# Patient Record
Sex: Female | Born: 2000 | Race: White | Hispanic: No | Marital: Single | Smoking: Never smoker
Health system: Southern US, Community
[De-identification: ages and names within clinical notes are randomized; demographics above are authoritative.]

## PROBLEM LIST (undated history)

## (undated) DIAGNOSIS — D649 Anemia, unspecified: Secondary | ICD-10-CM

## (undated) DIAGNOSIS — M069 Rheumatoid arthritis, unspecified: Secondary | ICD-10-CM

## (undated) HISTORY — DX: Rheumatoid arthritis, unspecified: M06.9

## (undated) HISTORY — DX: Anemia, unspecified: D64.9

---
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
## 2020-01-03 ENCOUNTER — Ambulatory Visit: Payer: Self-pay | Attending: Internal Medicine

## 2021-12-12 ENCOUNTER — Emergency Department
Admission: EM | Admit: 2021-12-12 | Discharge: 2021-12-12 | Disposition: A | Discharge status: Home / Self Care | Payer: BC Managed Care – PPO | Attending: Emergency Medicine | Admitting: Emergency Medicine

## 2021-12-12 ENCOUNTER — Emergency Department: Payer: BC Managed Care – PPO

## 2021-12-12 ENCOUNTER — Other Ambulatory Visit: Payer: Self-pay

## 2021-12-12 ENCOUNTER — Encounter: Payer: Self-pay | Admitting: Emergency Medicine

## 2021-12-12 DIAGNOSIS — M7121 Synovial cyst of popliteal space [Baker], right knee: Secondary | ICD-10-CM | POA: Diagnosis not present

## 2021-12-12 DIAGNOSIS — M79661 Pain in right lower leg: Secondary | ICD-10-CM | POA: Diagnosis present

## 2021-12-12 LAB — POC URINE PREG, ED: Preg Test, Ur: NEGATIVE

## 2021-12-12 MED ORDER — IBUPROFEN 600 MG PO TABS: 600.0000 mg | ORAL_TABLET | Freq: Four times a day (QID) | ORAL | 0 refills | Status: AC | PRN

## 2021-12-12 NOTE — ED Provider Notes (Addendum)
? ?[facility] ?Provider Note ? ? ? Event Date/Time  ? First MD Initiated Contact with Patient 12/12/21 1704   ?  (approximate) ? ? ?History  ? ?Leg Pain ? ? ?HPI ? ?Glenda Kennedy is a 21 y.o. female who is otherwise healthy who comes in for right calf pain to rule out DVT.  Patient reports back in May she noticed some decreased mobility in her right leg where she had difficulty flexing it secondary to some discomfort.  Then a few days ago she noticed some increasing pain in her calf but was then able to have full range of motion of her knee.  She denies any new trauma to her knee.  Denies any history of blood clots.  Denies any numbness or tingling.  She reports still being able to bear weight on it. ? ? ?Physical Exam  ? ?Triage Vital Signs: ?ED Triage Vitals  ?Enc Vitals Group  ?   BP 12/12/21 1624 121/87  ?   Pulse Rate 12/12/21 1624 67  ?   Resp 12/12/21 1624 16  ?   Temp 12/12/21 1624 97.9 ?F (36.6 ?C)  ?   Temp Source 12/12/21 1624 Oral  ?   SpO2 12/12/21 1624 95 %  ?   Weight 12/12/21 1622 165 lb (74.8 kg)  ?   Height 12/12/21 1622 5\' 8"  (1.727 m)  ?   Head Circumference --   ?   Peak Flow --   ?   Pain Score 12/12/21 1622 4  ?   Pain Loc --   ?   Pain Edu? --   ?   Excl. in GC? --   ? ? ?Most recent vital signs: ?Vitals:  ? 12/12/21 1624  ?BP: 121/87  ?Pulse: 67  ?Resp: 16  ?Temp: 97.9 ?F (36.6 ?C)  ?SpO2: 95%  ? ? ? ?General: Awake, no distress.  ?CV:  Good peripheral perfusion.  ?Resp:  Normal effort.  ?Abd:  No distention.  ?Other:  Patient has a little bit of fullness in her calf but soft compartments still.  She is got sensation is intact with good arterial pulses.  She has some slight swelling noted behind the right knee but no erythema noted. Slight warm noted.  ? ?ED Results / Procedures / Treatments  ? ?RADIOLOGY ?I have reviewed the US personally  and there is possible rupture bakers cyst  ? ? ?PROCEDURES: ? ?Critical Care performed: No ? ?Procedures ? ? ?MEDICATIONS ORDERED  IN ED: ?Medications - No data to display ? ? ?IMPRESSION / MDM / ASSESSMENT AND PLAN / ED COURSE  ?I reviewed the triage vital signs and the nursing notes. ?             ?               ? ?Differential diagnosis includes, but is not limited to, DVT, hematoma, Baker's cyst.  Lower suspicion for hematoma or fracture given patient denies any recent trauma.  Does not appear to be a septic joint based upon examination.  Patient is neurovascularly intact ? ?Ultrasound concerning for possible large ruptured Baker's cyst or hematoma.  She is currently neurovascularly intact we did discuss return precautions in regards to this.  Also incidentally noted lymph node which patient can get follow-up for outpatient but patient is afebrile do not believe this is an infectious process.  Discussed this with patient that she can discuss with her primary care doctor and the need for repeat  ultrasound in 4 to 6 weeks to see if it is still there  ? ?Recommended NSAIDs, rest, knee brace for comfort and patient actually already has follow-up with orthopedics in 2 days to discuss further management of this.  We discussed return precautions in regards to neurovascular issues ? ?I discussed the provisional nature of ED diagnosis, the treatment so far, the ongoing plan of care, follow up appointments and return precautions with the patient and any family or support people present. They expressed understanding and agreed with the plan, discharged home. ? ? ? ? ? ? ?FINAL CLINICAL IMPRESSION(S) / ED DIAGNOSES  ? ?Final diagnoses:  ?Synovial cyst of right popliteal space  ? ? ? ?Rx / DC Orders  ? ?ED Discharge Orders   ? ?      Ordered  ?  ibuprofen (ADVIL) 600 MG tablet  Every 6 hours PRN       ? 12/12/21 1836  ? ?  ?  ? ?  ? ? ? ?Note:  This document was prepared using Dragon voice recognition software and may include unintentional dictation errors. ?  ?Concha Se, MD ?12/12/21 1851 ? ?  ?Concha Se, MD ?12/12/21 1907 ? ?

## 2021-12-12 NOTE — ED Notes (Signed)
Pt in US at this time 

## 2021-12-12 NOTE — ED Triage Notes (Signed)
C/O right calf pain x 4 days.  Sent to ED to R?O DVT ?

## 2021-12-12 NOTE — Discharge Instructions (Addendum)
Follow-up with orthopedic doctor and take Tylenol 1 g every 8 hours and ibuprofen with food to help with any of the pain.  He can use the knee brace to help with comfort.  If you develop numbness or worsening calf pain or cold pale right foot please return ER immediately ? ?IMPRESSION: ?No evidence of deep venous thrombosis seen in right lower extremity. ?Possible large ruptured Baker's cyst or hematoma seen in right ?popliteal fossa and posterior calf. Also noted is 1.3 cm lymph node ?at the junction of the common femoral and greater saphenous veins. ?  ?

## 2022-07-08 ENCOUNTER — Other Ambulatory Visit: Payer: Self-pay

## 2022-07-08 ENCOUNTER — Ambulatory Visit (INDEPENDENT_AMBULATORY_CARE_PROVIDER_SITE_OTHER): Payer: BC Managed Care – PPO | Admitting: Oncology

## 2022-07-08 ENCOUNTER — Encounter: Payer: Self-pay | Admitting: Oncology

## 2022-07-08 VITALS — BP 106/74 | HR 113 | Temp 98.1°F | Resp 18 | Ht 69.0 in | Wt 171.0 lb

## 2022-07-08 DIAGNOSIS — M059 Rheumatoid arthritis with rheumatoid factor, unspecified: Secondary | ICD-10-CM

## 2022-07-08 NOTE — Progress Notes (Signed)
[address] [place], [place] [address] Phone: [phone] Fax: [phone]   Office Visit Note  Patient Name: Glenda Kennedy  Date of [id number]  Med Rec number [id number]  Date of Service: 07/08/2022  Patient has no known allergies.  Chief Complaint  Patient presents with   Bloodwork   Patient is an 21 y.o. student here for blood work ordered by Dr. Manuella Ghazi.  Would like results faxed to [facility] [place] at [phone].  Diagnosed with seropositive rheumatoid arthritis over the summer but has been having right knee and elbow pain since the spring 2022. Currently on plaquenil/ naproxen and prednisone which appears to be helping some.  Needs routine labs drawn here.  Will be seeing a [facility] rheumatologist in November.  Current Medication:  Outpatient Encounter Medications as of 07/08/2022  Medication Sig   hydroxychloroquine (PLAQUENIL) 200 MG tablet Take by mouth.   naproxen (NAPROSYN) 375 MG tablet Take 375 mg by mouth 2 (two) times daily.   predniSONE (DELTASONE) 2.5 MG tablet Take by mouth.   No facility-administered encounter medications on file as of 07/08/2022.    Medical History: History reviewed. No pertinent past medical history.  Vital Signs: BP 106/74   Pulse (!) 113   Temp 98.1 F (36.7 C) (Tympanic)   Resp 18   Ht _0  (1.753 m)   Wt 171 lb (77.6 kg)   SpO2 99%   BMI 25.25 kg/m   ROS: As per HPI.  All other pertinent ROS negative.     Review of Systems  Musculoskeletal:  Positive for joint swelling and myalgias.    Physical Exam Constitutional:      Appearance: Normal appearance.  Neurological:     Mental Status: She is alert and oriented to person, place, and time.     No results found for this or any previous visit (from the past 24 hour(s)).  Assessment/Plan: 1. Seropositive rheumatoid arthritis ([place]) -Labs today.  -We will fax labs to care [facility] at [phone] when  they result.  We will also send student results via [facility]. -Order requisition reviewed and will be scanned into epic.  - QuantiFERON-TB Gold Plus - CBC w/Diff - C-reactive protein - Acute Hep Panel & Hep B Surface Ab - Comp Met (CMET)   Disposition-return to clinic as needed.  General Counseling: Glenda Kennedy verbalizes understanding of the findings of todays visit and agrees with plan of treatment. I have discussed any further diagnostic evaluation that may be needed or ordered today. We also reviewed her medications today. she has been encouraged to call the office with any questions or concerns that should arise related to todays visit.   Orders Placed This Encounter  Procedures   QuantiFERON-TB Gold Plus   CBC w/Diff   C-reactive protein   Acute Hep Panel & Hep B Surface Ab   Comp Met (CMET)    No orders of the defined types were placed in this encounter.   I spent 15 minutes dedicated to the care of this patient (face-to-face and non-face-to-face) on the date of the encounter to include what is described in the assessment and plan.   Faythe Casa, NP 07/08/2022 11:25 AM

## 2022-07-10 LAB — QUANTIFERON-TB GOLD PLUS
QuantiFERON Mitogen Value: 2.33 IU/mL
QuantiFERON Nil Value: 0 IU/mL
QuantiFERON TB1 Ag Value: 0 IU/mL
QuantiFERON TB2 Ag Value: 0 IU/mL
QuantiFERON-TB Gold Plus: NEGATIVE

## 2022-07-10 LAB — COMPREHENSIVE METABOLIC PANEL
ALT: 10 IU/L (ref 0–32)
AST: 14 IU/L (ref 0–40)
Albumin/Globulin Ratio: 1.6 (ref 1.2–2.2)
Albumin: 4.7 g/dL (ref 4.0–5.0)
Alkaline Phosphatase: 92 IU/L (ref 44–121)
BUN/Creatinine Ratio: 20 (ref 9–23)
BUN: 15 mg/dL (ref 6–20)
Bilirubin Total: 0.2 mg/dL (ref 0.0–1.2)
CO2: 22 mmol/L (ref 20–29)
Calcium: 9.5 mg/dL (ref 8.7–10.2)
Chloride: 102 mmol/L (ref 96–106)
Creatinine, Ser: 0.74 mg/dL (ref 0.57–1.00)
Globulin, Total: 2.9 g/dL (ref 1.5–4.5)
Glucose: 98 mg/dL (ref 70–99)
Potassium: 4.4 mmol/L (ref 3.5–5.2)
Sodium: 141 mmol/L (ref 134–144)
Total Protein: 7.6 g/dL (ref 6.0–8.5)
eGFR: 118 mL/min/{1.73_m2} (ref >59)

## 2022-07-10 LAB — CBC WITH DIFFERENTIAL/PLATELET
Basophils Absolute: 0.1 10*3/uL (ref 0.0–0.2)
Basos: 0 %
EOS (ABSOLUTE): 0 10*3/uL (ref 0.0–0.4)
Eos: 0 %
Hematocrit: 36.5 % (ref 34.0–46.6)
Hemoglobin: 12.1 g/dL (ref 11.1–15.9)
Immature Grans (Abs): 0 10*3/uL (ref 0.0–0.1)
Immature Granulocytes: 0 %
Lymphocytes Absolute: 1.1 10*3/uL (ref 0.7–3.1)
Lymphs: 9 %
MCH: 28.5 pg (ref 26.6–33.0)
MCHC: 33.2 g/dL (ref 31.5–35.7)
MCV: 86 fL (ref 79–97)
Monocytes Absolute: 0.3 10*3/uL (ref 0.1–0.9)
Monocytes: 3 %
Neutrophils Absolute: 10 10*3/uL — ABNORMAL HIGH (ref 1.4–7.0)
Neutrophils: 88 %
Platelets: 332 10*3/uL (ref 150–450)
RBC: 4.24 x10E6/uL (ref 3.77–5.28)
RDW: 14.2 % (ref 11.7–15.4)
WBC: 11.6 10*3/uL — ABNORMAL HIGH (ref 3.4–10.8)

## 2022-07-10 LAB — ACUTE HEP PANEL AND HEP B SURFACE AB
Hep A IgM: NEGATIVE
Hep B C IgM: NEGATIVE
Hep C Virus Ab: NONREACTIVE
Hepatitis B Surf Ab Quant: 30.1 m[IU]/mL (ref >9.9)
Hepatitis B Surface Ag: NEGATIVE

## 2022-07-10 LAB — C-REACTIVE PROTEIN: CRP: 33 mg/L — ABNORMAL HIGH (ref 0–10)

## 2022-08-01 ENCOUNTER — Encounter: Payer: Self-pay | Admitting: Oncology

## 2022-08-16 ENCOUNTER — Ambulatory Visit
Admission: RE | Admit: 2022-08-16 | Discharge: 2022-08-16 | Disposition: A | Discharge status: Home / Self Care | Payer: BC Managed Care – PPO | Source: Ambulatory Visit | Attending: Internal Medicine | Admitting: Internal Medicine

## 2022-08-16 ENCOUNTER — Ambulatory Visit (INDEPENDENT_AMBULATORY_CARE_PROVIDER_SITE_OTHER): Payer: BC Managed Care – PPO | Admitting: Internal Medicine

## 2022-08-16 ENCOUNTER — Encounter: Payer: Self-pay | Admitting: Internal Medicine

## 2022-08-16 ENCOUNTER — Ambulatory Visit (INDEPENDENT_AMBULATORY_CARE_PROVIDER_SITE_OTHER): Payer: BC Managed Care – PPO

## 2022-08-16 VITALS — BP 104/68 | HR 60 | Resp 15 | Ht 68.0 in | Wt 175.8 lb

## 2022-08-16 DIAGNOSIS — Z7952 Long term (current) use of systemic steroids: Secondary | ICD-10-CM | POA: Diagnosis present

## 2022-08-16 DIAGNOSIS — Z79899 Other long term (current) drug therapy: Secondary | ICD-10-CM | POA: Insufficient documentation

## 2022-08-16 DIAGNOSIS — M059 Rheumatoid arthritis with rheumatoid factor, unspecified: Secondary | ICD-10-CM | POA: Diagnosis present

## 2022-08-16 DIAGNOSIS — M660 Rupture of popliteal cyst: Secondary | ICD-10-CM | POA: Diagnosis present

## 2022-08-16 DIAGNOSIS — Z1159 Encounter for screening for other viral diseases: Secondary | ICD-10-CM | POA: Insufficient documentation

## 2022-08-16 DIAGNOSIS — E559 Vitamin D deficiency, unspecified: Secondary | ICD-10-CM | POA: Insufficient documentation

## 2022-08-16 NOTE — Patient Instructions (Signed)
Methotrexate Tablets What is this medication? METHOTREXATE (METH oh TREX ate) treats inflammatory conditions such as arthritis and psoriasis. It works by decreasing inflammation, which can reduce pain and prevent long-term injury to the joints and skin. It may also be used to treat some types of cancer. It works by slowing down the growth of cancer cells. This medicine may be used for other purposes; ask your health care provider or pharmacist if you have questions. COMMON BRAND NAME(S): Rheumatrex, Trexall What should I tell my care team before I take this medication? They need to know if you have any of these conditions: Fluid in the stomach area or lungs If you often drink alcohol Infection or immune system problems Kidney disease or on hemodialysis Liver disease Low blood counts, like low white cell, platelet, or red cell counts Lung disease Radiation therapy Stomach ulcers Ulcerative colitis An unusual or allergic reaction to methotrexate, other medications, foods, dyes, or preservatives Pregnant or trying to get pregnant Breast-feeding How should I use this medication? Take this medication by mouth with a glass of water. Follow the directions on the prescription label. Take your medication at regular intervals. Do not take it more often than directed. Do not stop taking except on your care team's advice. Make sure you know why you are taking this medication and how often you should take it. If this medication is used for a condition that is not cancer, like arthritis or psoriasis, it should be taken weekly, NOT daily. Taking this medication more often than directed can cause serious side effects, even death. Talk to your care team about safe handling and disposal of this medication. You may need to take special precautions. Talk to your care team about the use of this medication in children. While this medication may be prescribed for selected conditions, precautions do  apply. Overdosage: If you think you have taken too much of this medicine contact a poison control center or emergency room at once. NOTE: This medicine is only for you. Do not share this medicine with others. What if I miss a dose? If you miss a dose, talk with your care team. Do not take double or extra doses. What may interact with this medication? Do not take this medication with any of the following: Acitretin This medication may also interact with the following: Aspirin and aspirin-like medications including salicylates Azathioprine Certain antibiotics like penicillins, tetracycline, and chloramphenicol Certain medications that treat or prevent blood clots like warfarin, apixaban, dabigatran, and rivaroxaban Certain medications for stomach problems like esomeprazole, omeprazole, pantoprazole Cyclosporine Dapsone Diuretics Gold Hydroxychloroquine Live virus vaccines Medications for infection like acyclovir, adefovir, amphotericin B, bacitracin, cidofovir, foscarnet, ganciclovir, gentamicin, pentamidine, vancomycin Mercaptopurine NSAIDs, medications for pain and inflammation, like ibuprofen or naproxen Other cytotoxic agents Pamidronate Pemetrexed Penicillamine Phenylbutazone Phenytoin Probenecid Pyrimethamine Retinoids such as isotretinoin and tretinoin Steroid medications like prednisone or cortisone Sulfonamides like sulfasalazine and trimethoprim/sulfamethoxazole Theophylline Zoledronic acid This list may not describe all possible interactions. Give your health care provider a list of all the medicines, herbs, non-prescription drugs, or dietary supplements you use. Also tell them if you smoke, drink alcohol, or use illegal drugs. Some items may interact with your medicine. What should I watch for while using this medication? Avoid alcoholic drinks. This medication can make you more sensitive to the sun. Keep out of the sun. If you cannot avoid being in the sun, wear  protective clothing and use sunscreen. Do not use sun lamps or tanning beds/booths. You may need   blood work done while you are taking this medication. Call your care team for advice if you get a fever, chills or sore throat, or other symptoms of a cold or flu. Do not treat yourself. This medication decreases your body's ability to fight infections. Try to avoid being around people who are sick. This medication may increase your risk to bruise or bleed. Call your care team if you notice any unusual bleeding. Be careful brushing or flossing your teeth or using a toothpick because you may get an infection or bleed more easily. If you have any dental work done, tell your dentist you are receiving this medication. Check with your care team if you get an attack of severe diarrhea, nausea and vomiting, or if you sweat a lot. The loss of too much body fluid can make it dangerous for you to take this medication. Talk to your care team about your risk of cancer. You may be more at risk for certain types of cancers if you take this medication. Do not become pregnant while taking this medication or for 6 months after stopping it. Women should inform their care team if they wish to become pregnant or think they might be pregnant. Men should not father a child while taking this medication and for 3 months after stopping it. There is potential for serious harm to an unborn child. Talk to your care team for more information. Do not breast-feed an infant while taking this medication or for 1 week after stopping it. This medication may make it more difficult to get pregnant or father a child. Talk to your care team if you are concerned about your fertility. What side effects may I notice from receiving this medication? Side effects that you should report to your care team as soon as possible: Allergic reactions--skin rash, itching, hives, swelling of the face, lips, tongue, or throat Blood clot--pain, swelling, or warmth  in the leg, shortness of breath, chest pain Dry cough, shortness of breath or trouble breathing Infection--fever, chills, cough, sore throat, wounds that don't heal, pain or trouble when passing urine, general feeling of discomfort or being unwell Kidney injury--decrease in the amount of urine, swelling of the ankles, hands, or feet Liver injury--right upper belly pain, loss of appetite, nausea, light-colored stool, dark yellow or brown urine, yellowing of the skin or eyes, unusual weakness or fatigue Low red blood cell count--unusual weakness or fatigue, dizziness, headache, trouble breathing Redness, blistering, peeling, or loosening of the skin, including inside the mouth Seizures Unusual bruising or bleeding Side effects that usually do not require medical attention (report to your care team if they continue or are bothersome): Diarrhea Dizziness Hair loss Nausea Pain, redness, or swelling with sores inside the mouth or throat Vomiting This list may not describe all possible side effects. Call your doctor for medical advice about side effects. You may report side effects to FDA at 1-800-FDA-1088. Where should I keep my medication? Keep out of the reach of children and pets. Store at room temperature between 20 and 25 degrees C (68 and 77 degrees F). Protect from light. Get rid of any unused medication after the expiration date. Talk to your care team about how to dispose of unused medication. Special directions may apply. NOTE: This sheet is a summary. It may not cover all possible information. If you have questions about this medicine, talk to your doctor, pharmacist, or health care provider.  2023 Elsevier/Gold Standard (2020-12-04 00:00:00)  Hydroxychloroquine Tablets What is this medication?  HYDROXYCHLOROQUINE (hye drox ee KLOR oh kwin) treats autoimmune conditions, such as rheumatoid arthritis and lupus. It works by slowing down an overactive immune system. It may also be used to  prevent and treat malaria. It works by killing the parasite that causes malaria. It belongs to a group of medications called DMARDs. This medicine may be used for other purposes; ask your health care provider or pharmacist if you have questions. COMMON BRAND NAME(S): Plaquenil, Quineprox What should I tell my care team before I take this medication? They need to know if you have any of these conditions: Diabetes Eye disease, vision problems Frequently drink alcohol G6PD deficiency Heart disease Irregular heartbeat or rhythm Kidney disease Liver disease Porphyria Psoriasis An unusual or allergic reaction to hydroxychloroquine, other medications, foods, dyes, or preservatives Pregnant or trying to get pregnant Breastfeeding How should I use this medication? Take this medication by mouth with water. Take it as directed on the prescription label. Do not cut, crush, or chew this medication. Swallow the tablets whole. Take it with food. Do not take it more than directed. Take all of this medication unless your care team tells you to stop it early. Keep taking it even if you think you are better. Take products with antacids in them at a different time of day than this medication. Take this medication 4 hours before or 4 hours after antacids. Talk to your care team if you have questions. Talk to your care team about the use of this medication in children. While this medication may be prescribed for selected conditions, precautions do apply. Overdosage: If you think you have taken too much of this medicine contact a poison control center or emergency room at once. NOTE: This medicine is only for you. Do not share this medicine with others. What if I miss a dose? If you miss a dose, take it as soon as you can. If it is almost time for your next dose, take only that dose. Do not take double or extra doses. What may interact with this medication? Do not take this medication with any of the  following: Cisapride Dronedarone Pimozide Thioridazine This medication may also interact with the following: Ampicillin Antacids Cimetidine Cyclosporine Digoxin Kaolin Medications for diabetes, such as insulin, glipizide, glyburide Medications for seizures, such as carbamazepine, phenobarbital, phenytoin Mefloquine Methotrexate Other medications that cause heart rhythm changes Praziquantel This list may not describe all possible interactions. Give your health care provider a list of all the medicines, herbs, non-prescription drugs, or dietary supplements you use. Also tell them if you smoke, drink alcohol, or use illegal drugs. Some items may interact with your medicine. What should I watch for while using this medication? Visit your care team for regular checks on your progress. Tell your care team if your symptoms do not start to get better or if they get worse. You may need blood work done while you are taking this medication. If you take other medications that can affect heart rhythm, you may need more testing. Talk to your care team if you have questions. Your vision may be tested before and during use of this medication. Tell your care team right away if you have any change in your eyesight. This medication may cause serious skin reactions. They can happen weeks to months after starting the medication. Contact your care team right away if you notice fevers or flu-like symptoms with a rash. The rash may be red or purple and then turn into blisters or peeling of  the skin. Or, you might notice a red rash with swelling of the face, lips or lymph nodes in your neck or under your arms. If you or your family notice any changes in your behavior, such as new or worsening depression, thoughts of harming yourself, anxiety, or other unusual or disturbing thoughts, or memory loss, call your care team right away. What side effects may I notice from receiving this medication? Side effects that you  should report to your care team as soon as possible: Allergic reactions--skin rash, itching, hives, swelling of the face, lips, tongue, or throat Aplastic anemia--unusual weakness or fatigue, dizziness, headache, trouble breathing, increased bleeding or bruising Change in vision Heart rhythm changes--fast or irregular heartbeat, dizziness, feeling faint or lightheaded, chest pain, trouble breathing Infection--fever, chills, cough, or sore throat Low blood sugar (hypoglycemia)--tremors or shaking, anxiety, sweating, cold or clammy skin, confusion, dizziness, rapid heartbeat Muscle injury--unusual weakness or fatigue, muscle pain, dark yellow or brown urine, decrease in amount of urine Pain, tingling, or numbness in the hands or feet Rash, fever, and swollen lymph nodes Redness, blistering, peeling, or loosening of the skin, including inside the mouth Thoughts of suicide or self-harm, worsening mood, or feelings of depression Unusual bruising or bleeding Side effects that usually do not require medical attention (report to your care team if they continue or are bothersome): Diarrhea Headache Nausea Stomach pain Vomiting This list may not describe all possible side effects. Call your doctor for medical advice about side effects. You may report side effects to FDA at 1-800-FDA-1088. Where should I keep my medication? Keep out of the reach of children and pets. Store at room temperature up to 30 degrees C (86 degrees F). Protect from light. Get rid of any unused medication after the expiration date. To get rid of medications that are no longer needed or have expired: Take the medication to a medication take-back program. Check with your pharmacy or law enforcement to find a location. If you cannot return the medication, check the label or package insert to see if the medication should be thrown out in the garbage or flushed down the toilet. If you are not sure, ask your care team. If it is safe  to put it in the trash, empty the medication out of the container. Mix the medication with cat litter, dirt, coffee grounds, or other unwanted substance. Seal the mixture in a bag or container. Put it in the trash. NOTE: This sheet is a summary. It may not cover all possible information. If you have questions about this medicine, talk to your doctor, pharmacist, or health care provider.  2023 Elsevier/Gold Standard (2004-12-11 00:00:00)

## 2022-08-16 NOTE — Progress Notes (Unsigned)
Office Visit Note  Patient: Glenda Kennedy             Date of Birth: [birth year]           MRN: [id number]             PCP: System, Provider Not In Referring: Dava Najjar, PA-C Visit Date: 08/16/2022 Occupation: Graduate student in communications  Subjective:  New Patient (Initial Visit) (Joint pain, especially right knee and right elbow.)   History of Present Illness: Glenda Kennedy is a 21 y.o. 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 She had previous evaluation for to pain and swelling in her right knee and elbow during the past year. She had multiple visits with primary care and orthopedics clinic without much follow up at any particular site due to travelling a lot. Also had ED evaluation with distal right leg swelling found to be ruptured baker's cyst and finally prompted MRI study. Imaging also showing right knee meniscal tear. This was not thought to explain all inflammatory symptoms and serology testing positive with RF 77, CCP 264.3, and CRP of 64.1. Negative testing for Lyme and negative ANA. She had fluid aspirated from the knee with inflammatory fluid without evidence of infection. Initial treatment with prednisone taper improved most joints incomplete improvement in elbow and knee. Treatment started with HCQ 400 mg daily and prednisone at 7.5 mg daily. Repeated knee steroid injection in August. She was recommended to consider biologic DMARD but not started due to anticipated relocation need for follow up plan. Currently she is also taking naproxen and symptoms are about average today, with some generalized pain but mostly right elbow and knee.  DMARDs HCQ 2023-current Prednisone 7.5 mg daily Naproxen 375 mg BID  Labs reviewed RF 77 CCP 264.2 ANA negative ESR 38 CRP 64.1 CBC unremarkable BMP unremarkable  04/01/22 Synovial Fluid WBCs 16,335 RBCs 859 No crystals Negative Cx Negative stain 79% neutrophils  Activities of Daily  Living:  Patient reports morning stiffness for 2-3 hours.   Patient Reports nocturnal pain.  Difficulty dressing/grooming: Denies Difficulty climbing stairs: Reports Difficulty getting out of chair: Reports Difficulty using hands for taps, buttons, cutlery, and/or writing: Denies  Review of Systems  Constitutional:  Negative for fatigue.  HENT:  Negative for mouth sores and mouth dryness.   Eyes:  Negative for dryness.  Respiratory:  Negative for shortness of breath.   Cardiovascular:  Negative for chest pain and palpitations.  Gastrointestinal:  Negative for blood in stool, constipation and diarrhea.  Endocrine: Negative for increased urination.  Genitourinary:  Negative for involuntary urination.  Musculoskeletal:  Positive for joint pain, gait problem, joint pain, joint swelling, myalgias, muscle weakness, morning stiffness, muscle tenderness and myalgias.  Skin:  Negative for color change, rash, hair loss and sensitivity to sunlight.  Allergic/Immunologic: Negative for susceptible to infections.  Neurological:  Positive for dizziness. Negative for headaches.  Hematological:  Negative for swollen glands.  Psychiatric/Behavioral:  Negative for depressed mood and sleep disturbance. The patient is nervous/anxious.     PMFS History:  Patient Active Problem List   Diagnosis Date Noted   Seropositive rheumatoid arthritis ([place]) 08/16/2022   High risk medication use 08/16/2022   Ruptured Bakers cyst 08/16/2022   Vitamin D deficiency 08/16/2022   Long term current use of systemic steroids 08/16/2022   Screening for viral disease 08/16/2022    Past Medical History:  Diagnosis Date   Anemia    Rheumatoid arthritis ([place])  Family History  Problem Relation Age of Onset   Healthy Mother    Healthy Father    Healthy Sister    Healthy Brother    History reviewed. No pertinent surgical history. Social History   Social History Narrative   Not on file    There is no  immunization history on file for this patient.   Objective: Vital Signs: BP 104/68 (BP Location: Right Arm, Patient Position: Sitting, Cuff Size: Normal)   Pulse 60   Resp 15   Ht _0  (1.727 m)   Wt 175 lb 12.8 oz (79.7 kg)   BMI 26.73 kg/m    Physical Exam HENT:     Mouth/Throat:     Mouth: Mucous membranes are moist.     Pharynx: Oropharynx is clear.  Eyes:     Conjunctiva/sclera: Conjunctivae normal.  Cardiovascular:     Rate and Rhythm: Normal rate and regular rhythm.  Pulmonary:     Effort: Pulmonary effort is normal.     Breath sounds: Normal breath sounds.  Lymphadenopathy:     Cervical: No cervical adenopathy.  Skin:    General: Skin is warm and dry.     Findings: No rash.     Comments: Coarse appearing hair on extremities No nail changes  Neurological:     Mental Status: She is alert.  Psychiatric:        Mood and Affect: Mood normal.      Musculoskeletal Exam:  Neck full ROM no tenderness Shoulders full ROM no tenderness or swelling Right elbow swelling present, redness and warmth to touch, decreased flexion and extension ROM, left elbow normal Wrists full ROM no tenderness or swelling Fingers full ROM no tenderness or swelling Hip normal internal and external rotation without pain, no tenderness to lateral hip palpation Right knee large effusion present, ROM is intact, posterior calf swelling present without erythema or warmth or skin changes Ankles full ROM no tenderness or swelling MTPs full ROM no tenderness or swelling    CDAI Exam: CDAI Score: 10  Patient Global: 30 mm; Provider Global: 30 mm Swollen: 2 ; Tender: 2  Joint Exam 08/16/2022      Right  Left  Elbow  Swollen Tender     Knee  Swollen Tender        Investigation: No additional findings.  Imaging: XR Elbow 2 Views Right  Result Date: 08/17/2022 Xray right elbow 2 views Normal joint space and alignment no degenerative changes. No erosions or abnormal calcifications seen.  Impression Normal elbow xray  DG Chest 2 View  Result Date: 08/17/2022 CLINICAL DATA:  Rheumatoid arthritis, methotrexate therapy EXAM: CHEST - 2 VIEW COMPARISON:  None Available. FINDINGS: The heart size and mediastinal contours are within normal limits. Both lungs are clear. The visualized skeletal structures are unremarkable. IMPRESSION: No active cardiopulmonary disease. Electronically Signed   By: Jerilynn Mages.  Shick M.D.   On: 08/17/2022 14:57    Recent Labs: Lab Results  Component Value Date   WBC 8.5 08/16/2022   HGB 10.6 (L) 08/16/2022   PLT 291 08/16/2022   NA 141 08/16/2022   K 4.4 08/16/2022   CL 105 08/16/2022   CO2 28 08/16/2022   GLUCOSE 78 08/16/2022   BUN 16 08/16/2022   CREATININE 0.54 08/16/2022   BILITOT 0.3 08/16/2022   ALKPHOS 92 07/08/2022   AST 12 08/16/2022   ALT 8 08/16/2022   PROT 6.7 08/16/2022   ALBUMIN 4.7 07/08/2022   CALCIUM 9.1 08/16/2022  Speciality Comments: No specialty comments available.  Procedures:  No procedures performed Allergies: Patient has no known allergies.   Assessment / Plan:     Visit Diagnoses: Seropositive rheumatoid arthritis ([place]) - Plan: XR Elbow 2 Views Right, Sedimentation rate, C-reactive protein  Clearly active disease despite current medications HCQ 400 mg, prednisone 7.5 mg, and naproxen 375 mg. Checking ESR and CRP for disease activity monitoring and Plan to start methotrexate 15 mg PO weekly and folic acid 1 mg daily. Discussed future possibly add on Humira in the future if inadequate improvement allowing steroid tapering or symptoms remission.  High risk medication use  Screening for viral disease - Plan: Hepatitis B core antibody, IgM, Hepatitis B surface antigen, Hepatitis C antibody, QuantiFERON-TB Gold Plus, HIV Antibody (routine testing w rflx), CBC with Differential/Platelet, COMPLETE METABOLIC PANEL WITH GFR, DG Chest 2 View  Discussed risks of methotrexate treatment including GI intolerance, hepatotoxicity,  cytopenias, and need for lab monitoring. She has IUD for contraception with no plans in the immediate future. Checking baseline lab workup hepatitis serology, CBC, CMP, also chest xray and going ahead with CXR anticipating possible future biologics. Also discussed retinal toxicity screening for HCQ needed in 1st year for baseline evaluation.  Ruptured Bakers cyst  Some right calf swelling but not very prominent overall today, no erythema or bruising distal to the knee. Recent injection in August was less than 3 months ago so would not repeat today, adding systemic therapy as above.  Vitamin D deficiency  Long term current use of systemic steroids - Plan: VITAMIN D 25 Hydroxy (Vit-D Deficiency, Fractures)- Plan: VITAMIN D 25 Hydroxy (Vit-D Deficiency, Fractures)  Checking vitamin D level with diagnosis of RA and current use of steroids recommending optimization for long term osteoporosis risk,    Orders: Orders Placed This Encounter  Procedures   XR Elbow 2 Views Right   DG Chest 2 View   Sedimentation rate   C-reactive protein   Hepatitis B core antibody, IgM   Hepatitis B surface antigen   Hepatitis C antibody   QuantiFERON-TB Gold Plus   HIV Antibody (routine testing w rflx)   CBC with Differential/Platelet   COMPLETE METABOLIC PANEL WITH GFR   VITAMIN D 25 Hydroxy (Vit-D Deficiency, Fractures)   No orders of the defined types were placed in this encounter.   Follow-Up Instructions: Return in about 1 month (around 09/17/2022) for New pt RA HCQ/GC/NSAID/start MTX f/u ~2mo   CCollier Salina MD  Note - This record has been created using [employer]  Chart creation errors have been sought, but may not always  have been located. Such creation errors do not reflect on  the standard of medical care.

## 2022-08-19 MED ORDER — FOLIC ACID 1 MG PO TABS: 1.0000 mg | ORAL_TABLET | Freq: Every day | ORAL | 0 refills | Status: DC

## 2022-08-19 MED ORDER — METHOTREXATE SODIUM 2.5 MG PO TABS: 15.0000 mg | ORAL_TABLET | ORAL | 0 refills | Status: DC

## 2022-08-19 NOTE — Progress Notes (Signed)
Lab results at initial visit show high levels of inflammation with sedimentation rate of 25 and CRP of 40.  There are no concerning labs regarding methotrexate use also normal chest x-ray. I will send the new prescription for this to the pharmacy.  The dose will be 15 mg which is 6 tablets once weekly and needs to take 1 mg folic acid daily while on this medicine to minimize side effect risk.

## 2022-08-19 NOTE — Addendum Note (Signed)
Addended by: Collier Salina on: 08/19/2022 12:31 AM   Modules accepted: Orders

## 2022-08-21 LAB — COMPLETE METABOLIC PANEL WITH GFR
AG Ratio: 1.5 (calc) (ref 1.0–2.5)
ALT: 8 U/L (ref 6–29)
AST: 12 U/L (ref 10–30)
Albumin: 4 g/dL (ref 3.6–5.1)
Alkaline phosphatase (APISO): 64 U/L (ref 31–125)
BUN: 16 mg/dL (ref 7–25)
CO2: 28 mmol/L (ref 20–32)
Calcium: 9.1 mg/dL (ref 8.6–10.2)
Chloride: 105 mmol/L (ref 98–110)
Creat: 0.54 mg/dL (ref 0.50–0.96)
Globulin: 2.7 g/dL (calc) (ref 1.9–3.7)
Glucose, Bld: 78 mg/dL (ref 65–99)
Potassium: 4.4 mmol/L (ref 3.5–5.3)
Sodium: 141 mmol/L (ref 135–146)
Total Bilirubin: 0.3 mg/dL (ref 0.2–1.2)
Total Protein: 6.7 g/dL (ref 6.1–8.1)
eGFR: 134 mL/min/{1.73_m2} (ref >=60)

## 2022-08-21 LAB — CBC WITH DIFFERENTIAL/PLATELET
Absolute Monocytes: 561 cells/uL (ref 200–950)
Basophils Absolute: 51 cells/uL (ref 0–200)
Basophils Relative: 0.6 %
Eosinophils Absolute: 111 cells/uL (ref 15–500)
Eosinophils Relative: 1.3 %
HCT: 33.8 % — ABNORMAL LOW (ref 35.0–45.0)
Hemoglobin: 10.6 g/dL — ABNORMAL LOW (ref 11.7–15.5)
Lymphs Abs: 1998 cells/uL (ref 850–3900)
MCH: 28.1 pg (ref 27.0–33.0)
MCHC: 31.4 g/dL — ABNORMAL LOW (ref 32.0–36.0)
MCV: 89.7 fL (ref 80.0–100.0)
MPV: 10.7 fL (ref 7.5–12.5)
Monocytes Relative: 6.6 %
Neutro Abs: 5780 cells/uL (ref 1500–7800)
Neutrophils Relative %: 68 %
Platelets: 291 10*3/uL (ref 140–400)
RBC: 3.77 10*6/uL — ABNORMAL LOW (ref 3.80–5.10)
RDW: 12.7 % (ref 11.0–15.0)
Total Lymphocyte: 23.5 %
WBC: 8.5 10*3/uL (ref 3.8–10.8)

## 2022-08-21 LAB — QUANTIFERON-TB GOLD PLUS
Mitogen-NIL: 4.72 IU/mL
NIL: 0.02 IU/mL
QuantiFERON-TB Gold Plus: NEGATIVE
TB1-NIL: 0.01 IU/mL
TB2-NIL: 0.01 IU/mL

## 2022-08-21 LAB — C-REACTIVE PROTEIN: CRP: 40 mg/L — ABNORMAL HIGH (ref <8.0)

## 2022-08-21 LAB — HEPATITIS B CORE ANTIBODY, IGM: Hep B C IgM: NONREACTIVE

## 2022-08-21 LAB — HEPATITIS B SURFACE ANTIGEN: Hepatitis B Surface Ag: NONREACTIVE

## 2022-08-21 LAB — HIV ANTIBODY (ROUTINE TESTING W REFLEX): HIV 1&2 Ab, 4th Generation: NONREACTIVE

## 2022-08-21 LAB — SEDIMENTATION RATE: Sed Rate: 25 mm/h — ABNORMAL HIGH (ref 0–20)

## 2022-08-21 LAB — HEPATITIS C ANTIBODY: Hepatitis C Ab: NONREACTIVE

## 2022-08-21 LAB — VITAMIN D 25 HYDROXY (VIT D DEFICIENCY, FRACTURES): Vit D, 25-Hydroxy: 28 ng/mL — ABNORMAL LOW (ref 30–100)

## 2022-09-08 ENCOUNTER — Other Ambulatory Visit: Payer: Self-pay | Admitting: Internal Medicine

## 2022-09-08 DIAGNOSIS — M059 Rheumatoid arthritis with rheumatoid factor, unspecified: Secondary | ICD-10-CM

## 2022-09-09 NOTE — Telephone Encounter (Signed)
Next Visit: 09/16/2022  Last Visit: 08/16/2022  Last Fill: 08/19/2022  DX: Seropositive rheumatoid arthritis   Current Dose per office note 08/16/2022: methotrexate 15 mg PO weekly   Labs: 08/16/2022 RBC 3.77, Hgb 10.6, Hct 33.8, MCHC 31.4  Okay to refill MTX?

## 2022-09-12 ENCOUNTER — Other Ambulatory Visit: Payer: Self-pay | Admitting: *Deleted

## 2022-09-12 ENCOUNTER — Encounter: Payer: Self-pay | Admitting: *Deleted

## 2022-09-12 NOTE — Telephone Encounter (Signed)
Next Visit: 09/16/2022  Last Visit: 08/16/2022  Labs: 08/16/2022 Lab results at initial visit show high levels of inflammation with sedimentation rate of 25 and CRP of 40   Eye exam: not on file   Current Dose per office note 08/16/2022: HCQ 400 mg, prednisone 7.5 mg, and naproxen 375 mg   IT:[id number] rheumatoid arthritis    Okay to refill Plaquenil, Prednisone and Naproxen?

## 2022-09-13 MED ORDER — HYDROXYCHLOROQUINE SULFATE 200 MG PO TABS: 200.0000 mg | ORAL_TABLET | Freq: Two times a day (BID) | ORAL | 2 refills | Status: DC

## 2022-09-13 MED ORDER — NAPROXEN 375 MG PO TABS: 375.0000 mg | ORAL_TABLET | Freq: Two times a day (BID) | ORAL | 2 refills | Status: DC

## 2022-09-13 MED ORDER — PREDNISONE 2.5 MG PO TABS: 7.5000 mg | ORAL_TABLET | Freq: Every day | ORAL | 2 refills | Status: DC

## 2022-09-15 NOTE — Progress Notes (Deleted)
Office Visit Note  Patient: Glenda Kennedy             Date of Birth: Dec 13, 2000           MRN: [id number]             PCP: System, Provider Not In Referring: No ref. provider found Visit Date: 09/16/2022   Subjective:  No chief complaint on file.   History of Present Illness: Glenda Kennedy is a 21 y.o. female here for follow up for seropositive RA on HCQ 400 mg daily and recommended to start methotrexate 15 mg PO weekly after initial visit due to continued symptoms requiring long term NSAIDs and prednisone at 7.5 mg daily. ***   Previous HPI 08/16/22 Glenda Kennedy is a 21 y.o. 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 She had previous evaluation for to pain and swelling in her right knee and elbow during the past year. She had multiple visits with primary care and orthopedics clinic without much follow up at any particular site due to travelling a lot. Also had ED evaluation with distal right leg swelling found to be ruptured baker's cyst and finally prompted MRI study. Imaging also showing right knee meniscal tear. This was not thought to explain all inflammatory symptoms and serology testing positive with RF 77, CCP 264.3, and CRP of 64.1. Negative testing for Lyme and negative ANA. She had fluid aspirated from the knee with inflammatory fluid without evidence of infection. Initial treatment with prednisone taper improved most joints incomplete improvement in elbow and knee. Treatment started with HCQ 400 mg daily and prednisone at 7.5 mg daily. Repeated knee steroid injection in August. She was recommended to consider biologic DMARD but not started due to anticipated relocation need for follow up plan. Currently she is also taking naproxen and symptoms are about average today, with some generalized pain but mostly right elbow and knee.   DMARDs HCQ 2023-current Prednisone 7.5 mg daily Naproxen 375 mg BID   Labs reviewed RF 77 CCP 264.2 ANA  negative ESR 38 CRP 64.1 CBC unremarkable BMP unremarkable   No Rheumatology ROS completed.   PMFS History:  Patient Active Problem List   Diagnosis Date Noted   Seropositive rheumatoid arthritis ([place]) 08/16/2022   High risk medication use 08/16/2022   Ruptured Bakers cyst 08/16/2022   Vitamin D deficiency 08/16/2022   Long term current use of systemic steroids 08/16/2022   Screening for viral disease 08/16/2022    Past Medical History:  Diagnosis Date   Anemia    Rheumatoid arthritis ([place])     Family History  Problem Relation Age of Onset   Healthy Mother    Healthy Father    Healthy Sister    Healthy Brother    No past surgical history on file. Social History   Social History Narrative   Not on file    There is no immunization history on file for this patient.   Objective: Vital Signs: There were no vitals taken for this visit.   Physical Exam   Musculoskeletal Exam: ***  CDAI Exam: CDAI Score: -- Patient Global: --; Provider Global: -- Swollen: --; Tender: -- Joint Exam 09/16/2022   No joint exam has been documented for this visit   There is currently no information documented on the homunculus. Go to the Rheumatology activity and complete the homunculus joint exam.  Investigation: No additional findings.  Imaging: No results found.  Recent Labs: Lab Results  Component Value Date   WBC 8.5 08/16/2022   HGB 10.6 (L) 08/16/2022   PLT 291 08/16/2022   NA 141 08/16/2022   K 4.4 08/16/2022   CL 105 08/16/2022   CO2 28 08/16/2022   GLUCOSE 78 08/16/2022   BUN 16 08/16/2022   CREATININE 0.54 08/16/2022   BILITOT 0.3 08/16/2022   ALKPHOS 92 07/08/2022   AST 12 08/16/2022   ALT 8 08/16/2022   PROT 6.7 08/16/2022   ALBUMIN 4.7 07/08/2022   CALCIUM 9.1 08/16/2022   QFTBGOLDPLUS NEGATIVE 08/16/2022    Speciality Comments: No specialty comments available.  Procedures:  No procedures performed Allergies: Patient has no known allergies.    Assessment / Plan:     Visit Diagnoses: No diagnosis found.  ***  Orders: No orders of the defined types were placed in this encounter.  No orders of the defined types were placed in this encounter.    Follow-Up Instructions: No follow-ups on file.   Collier Salina, MD  Note - This record has been created using [employer].  Chart creation errors have been sought, but may not always  have been located. Such creation errors do not reflect on  the standard of medical care.

## 2022-09-16 ENCOUNTER — Other Ambulatory Visit: Payer: Self-pay | Admitting: *Deleted

## 2022-09-16 ENCOUNTER — Ambulatory Visit: Payer: BC Managed Care – PPO | Admitting: Internal Medicine

## 2022-09-16 DIAGNOSIS — M059 Rheumatoid arthritis with rheumatoid factor, unspecified: Secondary | ICD-10-CM

## 2022-09-16 DIAGNOSIS — Z7952 Long term (current) use of systemic steroids: Secondary | ICD-10-CM

## 2022-09-16 DIAGNOSIS — Z79899 Other long term (current) drug therapy: Secondary | ICD-10-CM

## 2022-09-17 LAB — CBC WITH DIFFERENTIAL/PLATELET
Absolute Monocytes: 248 cells/uL (ref 200–950)
Basophils Absolute: 32 cells/uL (ref 0–200)
Basophils Relative: 0.4 %
Eosinophils Absolute: 8 cells/uL — ABNORMAL LOW (ref 15–500)
Eosinophils Relative: 0.1 %
HCT: 33.9 % — ABNORMAL LOW (ref 35.0–45.0)
Hemoglobin: 11.3 g/dL — ABNORMAL LOW (ref 11.7–15.5)
Lymphs Abs: 1016 cells/uL (ref 850–3900)
MCH: 29 pg (ref 27.0–33.0)
MCHC: 33.3 g/dL (ref 32.0–36.0)
MCV: 86.9 fL (ref 80.0–100.0)
MPV: 10.5 fL (ref 7.5–12.5)
Monocytes Relative: 3.1 %
Neutro Abs: 6696 cells/uL (ref 1500–7800)
Neutrophils Relative %: 83.7 %
Platelets: 346 10*3/uL (ref 140–400)
RBC: 3.9 10*6/uL (ref 3.80–5.10)
RDW: 12.8 % (ref 11.0–15.0)
Total Lymphocyte: 12.7 %
WBC: 8 10*3/uL (ref 3.8–10.8)

## 2022-09-17 LAB — COMPLETE METABOLIC PANEL WITH GFR
AG Ratio: 1.5 (calc) (ref 1.0–2.5)
ALT: 8 U/L (ref 6–29)
AST: 13 U/L (ref 10–30)
Albumin: 4.4 g/dL (ref 3.6–5.1)
Alkaline phosphatase (APISO): 79 U/L (ref 31–125)
BUN: 11 mg/dL (ref 7–25)
CO2: 26 mmol/L (ref 20–32)
Calcium: 9.5 mg/dL (ref 8.6–10.2)
Chloride: 102 mmol/L (ref 98–110)
Creat: 0.71 mg/dL (ref 0.50–0.96)
Globulin: 3 g/dL (calc) (ref 1.9–3.7)
Glucose, Bld: 121 mg/dL — ABNORMAL HIGH (ref 65–99)
Potassium: 4.5 mmol/L (ref 3.5–5.3)
Sodium: 138 mmol/L (ref 135–146)
Total Bilirubin: 0.4 mg/dL (ref 0.2–1.2)
Total Protein: 7.4 g/dL (ref 6.1–8.1)
eGFR: 124 mL/min/{1.73_m2} (ref >=60)

## 2022-10-23 ENCOUNTER — Encounter: Payer: Self-pay | Admitting: Internal Medicine

## 2022-10-23 ENCOUNTER — Ambulatory Visit: Payer: BC Managed Care – PPO | Attending: Internal Medicine | Admitting: Internal Medicine

## 2022-10-23 VITALS — BP 95/65 | HR 76 | Resp 14 | Ht 68.0 in | Wt 174.0 lb

## 2022-10-23 DIAGNOSIS — Z79899 Other long term (current) drug therapy: Secondary | ICD-10-CM

## 2022-10-23 DIAGNOSIS — M059 Rheumatoid arthritis with rheumatoid factor, unspecified: Secondary | ICD-10-CM

## 2022-10-23 DIAGNOSIS — M25561 Pain in right knee: Secondary | ICD-10-CM

## 2022-10-23 DIAGNOSIS — M660 Rupture of popliteal cyst: Secondary | ICD-10-CM | POA: Diagnosis not present

## 2022-10-23 NOTE — Progress Notes (Signed)
Office Visit Note  Patient: Glenda Kennedy             Date of Birth: 21-Jun-2001           MRN: [id number]             PCP: System, Provider Not In Referring: No ref. provider found Visit Date: 10/23/2022   Subjective:   History of Present Illness: Glenda Kennedy is a 22 y.o. female here for follow up for seropositive RA on HCQ 400 mg daily, prednisone 7.5 mg daily, naproxen 375 mg BID, and started methotrexate 15 mg p.o. weekly after visit in November. So far she did not see any significant improvement in symptoms with addition of methotrexate.  Not having any particular intolerance either but still with joint pain stiffness and visible swelling and on some days severely limiting her mobility or ability to carry out a normal class load.  Previous HPI 08/16/22  Glenda Kennedy is a 22 y.o. 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 She had previous evaluation for to pain and swelling in her right knee and elbow during the past year. She had multiple visits with primary care and orthopedics clinic without much follow up at any particular site due to travelling a lot. Also had ED evaluation with distal right leg swelling found to be ruptured baker's cyst and finally prompted MRI study. Imaging also showing right knee meniscal tear. This was not thought to explain all inflammatory symptoms and serology testing positive with RF 77, CCP 264.3, and CRP of 64.1. Negative testing for Lyme and negative ANA. She had fluid aspirated from the knee with inflammatory fluid without evidence of infection. Initial treatment with prednisone taper improved most joints incomplete improvement in elbow and knee. Treatment started with HCQ 400 mg daily and prednisone at 7.5 mg daily. Repeated knee steroid injection in August. She was recommended to consider biologic DMARD but not started due to anticipated relocation need for follow up plan. Currently she is also taking naproxen and symptoms  are about average today, with some generalized pain but mostly right elbow and knee.   DMARDs HCQ 2023-current Prednisone 7.5 mg daily Naproxen 375 mg BID   Labs reviewed RF 77 CCP 264.2 ANA negative ESR 38 CRP 64.1 CBC unremarkable BMP unremarkable  04/01/22 Synovial Fluid WBCs 16,335 RBCs 859 No crystals Negative Cx Negative stain 79% neutrophils   Review of Systems  Constitutional:  Positive for fatigue.  HENT:  Positive for mouth dryness. Negative for mouth sores.   Eyes:  Negative for dryness.  Respiratory:  Negative for shortness of breath.   Cardiovascular:  Positive for palpitations. Negative for chest pain.  Gastrointestinal:  Positive for diarrhea. Negative for blood in stool and constipation.  Endocrine: Negative for increased urination.  Genitourinary:  Negative for involuntary urination.  Musculoskeletal:  Positive for joint pain, joint pain, joint swelling, myalgias, morning stiffness and myalgias. Negative for gait problem, muscle weakness and muscle tenderness.  Skin:  Negative for color change, rash, hair loss and sensitivity to sunlight.  Allergic/Immunologic: Negative for susceptible to infections.  Neurological:  Positive for dizziness and headaches.  Hematological:  Negative for swollen glands.  Psychiatric/Behavioral:  Negative for depressed mood and sleep disturbance. The patient is nervous/anxious.     PMFS History:  Patient Active Problem List   Diagnosis Date Noted   Seropositive rheumatoid arthritis ([place]) 08/16/2022   High risk medication use 08/16/2022   Ruptured Bakers cyst 08/16/2022  Vitamin D deficiency 08/16/2022   Long term current use of systemic steroids 08/16/2022   Screening for viral disease 08/16/2022    Past Medical History:  Diagnosis Date   Anemia    Rheumatoid arthritis ([place])     Family History  Problem Relation Age of Onset   Healthy Mother    Healthy Father    Healthy Sister    Healthy Brother    History  reviewed. No pertinent surgical history. Social History   Social History Narrative   Not on file    There is no immunization history on file for this patient.   Objective: Vital Signs: BP 95/65 (BP Location: Left Arm, Patient Position: Sitting, Cuff Size: Normal)   Pulse 76   Resp 14   Ht 5\' 8"  (1.727 m)   Wt 174 lb (78.9 kg)   BMI 26.46 kg/m    Physical Exam Eyes:     Conjunctiva/sclera: Conjunctivae normal.  Cardiovascular:     Rate and Rhythm: Normal rate and regular rhythm.  Pulmonary:     Effort: Pulmonary effort is normal.     Breath sounds: Normal breath sounds.  Musculoskeletal:     Right lower leg: No edema.     Left lower leg: No edema.  Skin:    General: Skin is warm and dry.     Findings: No rash.  Neurological:     Mental Status: She is alert.  Psychiatric:        Mood and Affect: Mood normal.      Musculoskeletal Exam:  Right elbow swelling is present very slightly tender to touch there is decreased flexion extension range of motion, left elbow normal Wrists full ROM no tenderness or swelling Fingers full ROM no tenderness or swelling No paraspinal tenderness to palpation over upper and lower back Hip normal internal and external rotation without pain, no tenderness to lateral hip palpation Right knee effusion present mild tenderness range of motion is overall intact, no left knee swelling   CDAI Exam: CDAI Score: 12  Patient Global: 50 mm; Provider Global: 30 mm Swollen: 2 ; Tender: 2  Joint Exam 10/23/2022      Right  Left  Elbow  Swollen Tender     Knee  Swollen Tender        Investigation: No additional findings.  Imaging: No results found.  Recent Labs: Lab Results  Component Value Date   WBC 8.0 09/16/2022   HGB 11.3 (L) 09/16/2022   PLT 346 09/16/2022   NA 138 09/16/2022   K 4.5 09/16/2022   CL 102 09/16/2022   CO2 26 09/16/2022   GLUCOSE 121 (H) 09/16/2022   BUN 11 09/16/2022   CREATININE 0.71 09/16/2022   BILITOT  0.4 09/16/2022   ALKPHOS 92 07/08/2022   AST 13 09/16/2022   ALT 8 09/16/2022   PROT 7.4 09/16/2022   ALBUMIN 4.7 07/08/2022   CALCIUM 9.5 09/16/2022   QFTBGOLDPLUS NEGATIVE 08/16/2022    Speciality Comments: No specialty comments available.  Procedures:  Large Joint Inj: R knee on 10/23/2022 4:04 PM Indications: pain and joint swelling Details: 22 G needle, superolateral approach Medications: 2 mL lidocaine 1 %; 40 mg triamcinolone acetonide 40 MG/ML Aspirate: 39 mL clear and yellow Outcome: tolerated well, no immediate complications Procedure, treatment alternatives, risks and benefits explained, specific risks discussed. Consent was given by the patient. Immediately prior to procedure a time out was called to verify the correct patient, procedure, equipment, support staff and site/side marked as  required. Patient was prepped and draped in the usual sterile fashion.     Allergies: Patient has no known allergies.   Assessment / Plan:     Visit Diagnoses: Seropositive rheumatoid arthritis ([facility]) - Plan: Large Joint Inj: R knee  Rheumatoid arthritis does not appear significantly better controlled after addition of methotrexate and has overt swelling in the elbow knee despite ongoing naproxen treatment as well.  I recommend we start her on Humira as a next step due to inadequate treatment response.  I cannot tell any benefit at all with the methotrexate so I do not think this is adding a very large amount of value we will just continue on the hydroxychloroquine 400 mg daily prednisone 7.5 mg daily and the naproxen for her oral medications in the meantime.  Ruptured Bakers cyst  Large right knee effusion somewhat painful not severely limiting mobility of the joint.  We would like to avoid increasing her daily steroid dose anymore if possible so knee aspiration and injection today for symptom relief tolerated without issue.  High risk medication use  In interval had monitoring labs  checked in December with CBC and CMP looked fine after starting the methotrexate.  At this time recommending start of Humira discussed risks of medication including drug allergy injection site reactions, infections, long-term treatment risk of lymphoma or nonmelanoma skin cancers.  Baseline screening including chest x-ray November looked okay she was negative for tuberculosis screening in November.  Orders: Orders Placed This Encounter  Procedures   Large Joint Inj: R knee   No orders of the defined types were placed in this encounter.    Follow-Up Instructions: Return in about 10 weeks (around 01/01/2023) for RA on HCQ/GC ADA start f/u 2-56mos.   Fuller Plan, MD  Note - This record has been created using [employer].  Chart creation errors have been sought, but may not always  have been located. Such creation errors do not reflect on  the standard of medical care.

## 2022-10-23 NOTE — Patient Instructions (Signed)
Adalimumab Injection What is this medication? ADALIMUMAB (ay da LIM yoo mab) treats autoimmune conditions, such as psoriasis, arthritis, Crohn's disease, and ulcerative colitis. It works by slowing down an overactive immune system. It belongs to a group of medications called TNF inhibitors. It is a monoclonal antibody. This medicine may be used for other purposes; ask your health care provider or pharmacist if you have questions. COMMON BRAND NAME(S): AMJEVITA, CYLTEZO, HADLIMA, Hulio, Hulio PEN, Humira, Hyrimoz, Idacio, YUFLYMA, YUSIMRY What should I tell my care team before I take this medication? They need to know if you have any of these conditions: Cancer Diabetes (high blood sugar) Having surgery Heart disease Hepatitis B Immune system problems Infections, such as tuberculosis (TB) or other bacterial, fungal, or viral infections Multiple sclerosis Recent or upcoming vaccine An unusual or allergic reaction to adalimumab, mannitol, latex, rubber, other medications, foods, dyes, or preservatives Pregnant or trying to get pregnant Breast-feeding How should I use this medication? This medication is injected under the skin. It may be given by your care team in a hospital or clinic setting. It may also be given at home. If you get this medication at home, you will be taught how to prepare and give it. Use exactly as directed. Take it as directed on the prescription label. Keep taking it unless your care team tells you to stop. This medication comes with INSTRUCTIONS FOR USE. Ask your pharmacist for directions on how to use this medication. Read the information carefully. Talk to your pharmacist or care team if you have questions. It is important that you put your used needles and syringes in a special sharps container. Do not put them in a trash can. If you do not have a sharps container, call your pharmacist or care team to get one. A special MedGuide will be given to you by the pharmacist  with each prescription and refill. If you are getting this medication in a hospital or clinic, a special MedGuide will be given to you before each treatment. Be sure to read this information carefully each time. Talk to your care team about the use of this medication in children. While it be prescribed for children as young as 2 years for selected conditions, precautions do apply. Overdosage: If you think you have taken too much of this medicine contact a poison control center or emergency room at once. NOTE: This medicine is only for you. Do not share this medicine with others. What if I miss a dose? If you get this medication at the hospital or clinic: it is important not to miss your dose. Call your care team if you are unable to keep an appointment. If you give yourself this medication at home: If you miss a dose, take it as soon as you can. If it is almost time for your next dose, take only that dose. Do not take double or extra doses. Call your care team with questions. What may interact with this medication? Do not take this medication with any of the following: Abatacept Anakinra Biologic medications, such as certolizumab, etanercept, golimumab, infliximab Live virus vaccines This medication may also interact with the following: Cyclosporine Theophylline Vaccines Warfarin This list may not describe all possible interactions. Give your health care provider a list of all the medicines, herbs, non-prescription drugs, or dietary supplements you use. Also tell them if you smoke, drink alcohol, or use illegal drugs. Some items may interact with your medicine. What should I watch for while using this medication? Visit   your care team for regular checks on your progress. Tell your care team if your symptoms do not start to get better or if they get worse. You will be tested for tuberculosis (TB) before you start this medication. If your care team prescribes any medication for TB, you should start  taking the TB medication before starting this medication. Make sure to finish the full course of TB medication. This medication may increase your risk of getting an infection. Call your care team for advice if you get a fever, chills, sore throat, or other symptoms of a cold or flu. Do not treat yourself. Try to avoid being around people who are sick. Talk to your care team about your risk of cancer. You may be more at risk for certain types of cancer if you take this medication. What side effects may I notice from receiving this medication? Side effects that you should report to your care team as soon as possible: Allergic reactions--skin rash, itching, hives, swelling of the face, lips, tongue, or throat Aplastic anemia--unusual weakness or fatigue, dizziness, headache, trouble breathing, increased bleeding or bruising Body pain, tingling, or numbness Heart failure--shortness of breath, swelling of the ankles, feet, or hands, sudden weight gain, unusual weakness or fatigue Infection--fever, chills, cough, sore throat, wounds that don't heal, pain or trouble when passing urine, general feeling of discomfort or being unwell Lupus-like syndrome--joint pain, swelling, or stiffness, butterfly-shaped rash on the face, rashes that get worse in the sun, fever, unusual weakness or fatigue Unusual bruising or bleeding Side effects that usually do not require medical attention (report to your care team if they continue or are bothersome): Headache Nausea Pain, redness, or irritation at injection site Runny or stuffy nose Sore throat Stomach pain This list may not describe all possible side effects. Call your doctor for medical advice about side effects. You may report side effects to FDA at 1-800-FDA-1088. Where should I keep my medication? Keep out of the reach of children and pets. Store in the refrigerator. Do not freeze. Keep this medication in the original packaging until you are ready to take it.  Protect from light. Get rid of any unused medication after the expiration date. This medication may be stored at room temperature for up to 14 days. Keep this medication in the original packaging. Protect from light. If it is stored at room temperature, get rid of any unused medication after 14 days or after it expires, whichever is first. To get rid of medications that are no longer needed or have expired: Take the medication to a medication take-back program. Check with your pharmacy or law enforcement to find a location. If you cannot return the medication, ask your pharmacist or care team how to get rid of this medication safely. NOTE: This sheet is a summary. It may not cover all possible information. If you have questions about this medicine, talk to your doctor, pharmacist, or health care provider.  2023 Elsevier/Gold Standard (2021-12-13 00:00:00)  

## 2022-10-31 ENCOUNTER — Telehealth: Payer: Self-pay | Admitting: Internal Medicine

## 2022-10-31 NOTE — Telephone Encounter (Signed)
Patient called stating Dr. Benjamine Mola wanted her to call back and let him know if there was paperwork to fill out or if she just needed a disability letter.  Patient states she just needs a letter which can be emailed or faxed to [facility]. Email:  [email or web address] Fax [phone]

## 2022-11-04 NOTE — Telephone Encounter (Signed)
Letter faxed.

## 2022-11-04 NOTE — Telephone Encounter (Signed)
I printed a letter that should address the discussed accomodation we can fax to the address she sent.

## 2022-11-09 ENCOUNTER — Telehealth: Payer: Self-pay | Admitting: Pharmacist

## 2022-11-09 MED ORDER — LIDOCAINE HCL 1 % IJ SOLN
2.0000 mL | INTRAMUSCULAR | Status: AC | PRN
  Administered 2022-10-23: 2 mL

## 2022-11-09 MED ORDER — TRIAMCINOLONE ACETONIDE 40 MG/ML IJ SUSP
40.0000 mg | INTRAMUSCULAR | Status: AC | PRN
  Administered 2022-10-23: 40 mg via INTRA_ARTICULAR

## 2022-11-09 NOTE — Telephone Encounter (Addendum)
Submitted a Prior Authorization request to [place]  for HUMIRA via CoverMyMeds. Will update once we receive a response.  Key: [id number]  Knox Saliva, PharmD, MPH, BCPS, CPP Clinical Pharmacist (Rheumatology and Pulmonology)  ----- Message from Collier Salina, MD sent at 11/09/2022  1:49 AM EST ----- Regarding: Humira Glenda Kennedy has moderately severe seropositive rheumatoid arthritis despite treatment with hydroxychloroquine and while on prednisone 7.5 mg daily. Trial of methotrexate for over 2 months with zero effect on symptoms and already having complications such as ruptured baker's cyst. Baseline screening should all be good since November. Would plan to start with in office first treatment dose.

## 2022-11-13 ENCOUNTER — Other Ambulatory Visit (HOSPITAL_COMMUNITY): Payer: Self-pay

## 2022-11-13 NOTE — Telephone Encounter (Signed)
Received notification from [facility]  regarding a prior authorization for [place]. Authorization has been APPROVED from 11/10/2022 to 11/11/2023. Approval letter sent to scan center.  Unable to run test claim because [facility] is not in network.  Patient can fill through [facility]: [phone]  Authorization # [id number]  New start visit is pending copay card information to populate in [employer] portal  Knox Saliva, PharmD, MPH, BCPS, CPP Clinical Pharmacist (Rheumatology and Pulmonology)

## 2022-11-19 NOTE — Telephone Encounter (Signed)
Per [employer] benefits overview, patient has a $6000 deductible and 40% coinsurance.  Humira copay card information has populated in [employer] portal: Issued: 11/18/2022 ID: [id number] GROUP: [id number] BIN: [id number] PCN: [id number] with patient. She confirms no current abx use, no current signs/symptoms of an infection and no major upcoming procedures/surgeries. She is scheduled for Humira new start on 11/21/2022. Will use sample  Knox Saliva, PharmD, MPH, BCPS, CPP Clinical Pharmacist (Rheumatology and Pulmonology)

## 2022-11-21 ENCOUNTER — Ambulatory Visit: Payer: BC Managed Care – PPO | Admitting: Pharmacist

## 2022-11-21 NOTE — Telephone Encounter (Signed)
Pt r/s Humira new start visit to 11/25/2022  Knox Saliva, PharmD, MPH, BCPS, CPP Clinical Pharmacist (Rheumatology and Pulmonology)

## 2022-11-25 ENCOUNTER — Ambulatory Visit: Payer: BC Managed Care – PPO | Attending: Internal Medicine | Admitting: Pharmacist

## 2022-11-25 DIAGNOSIS — M059 Rheumatoid arthritis with rheumatoid factor, unspecified: Secondary | ICD-10-CM

## 2022-11-25 DIAGNOSIS — Z7189 Other specified counseling: Secondary | ICD-10-CM

## 2022-11-25 DIAGNOSIS — Z79899 Other long term (current) drug therapy: Secondary | ICD-10-CM

## 2022-11-25 MED ORDER — HUMIRA (2 PEN) 40 MG/0.4ML ~~LOC~~ AJKT: 40.0000 mg | AUTO-INJECTOR | SUBCUTANEOUS | 0 refills | Status: DC

## 2022-11-25 NOTE — Progress Notes (Signed)
Pharmacy Note  Subjective:   Patient presents to clinic today to receive first dose of HUMIRA for seropositive rheumatoid arthritis. Patient currently takes hydroxychloroquine 200 mg twice daily, prednisone 7.5 mg daily, and naproxen 342m twice daily.  She was non-responder to MTX therefore this was discontinued at last OV.  Patient running a fever or have signs/symptoms of infection? No  Patient currently on antibiotics for the treatment of infection? No  Patient have any upcoming invasive procedures/surgeries? No  Objective: CMP     Component Value Date/Time   NA 138 09/16/2022 1444   NA 141 07/08/2022 1145   K 4.5 09/16/2022 1444   CL 102 09/16/2022 1444   CO2 26 09/16/2022 1444   GLUCOSE 121 (H) 09/16/2022 1444   BUN 11 09/16/2022 1444   BUN 15 07/08/2022 1145   CREATININE 0.71 09/16/2022 1444   CALCIUM 9.5 09/16/2022 1444   PROT 7.4 09/16/2022 1444   PROT 7.6 07/08/2022 1145   ALBUMIN 4.7 07/08/2022 1145   AST 13 09/16/2022 1444   ALT 8 09/16/2022 1444   ALKPHOS 92 07/08/2022 1145   BILITOT 0.4 09/16/2022 1444   BILITOT 0.2 07/08/2022 1145    CBC    Component Value Date/Time   WBC 8.0 09/16/2022 1444   RBC 3.90 09/16/2022 1444   HGB 11.3 (L) 09/16/2022 1444   HGB 12.1 07/08/2022 1145   HCT 33.9 (L) 09/16/2022 1444   HCT 36.5 07/08/2022 1145   PLT 346 09/16/2022 1444   PLT 332 07/08/2022 1145   MCV 86.9 09/16/2022 1444   MCV 86 07/08/2022 1145   MCH 29.0 09/16/2022 1444   MCHC 33.3 09/16/2022 1444   RDW 12.8 09/16/2022 1444   RDW 14.2 07/08/2022 1145   LYMPHSABS 1,016 09/16/2022 1444   LYMPHSABS 1.1 07/08/2022 1145   EOSABS 8 (L) 09/16/2022 1444   EOSABS 0.0 07/08/2022 1145   BASOSABS 32 09/16/2022 1444   BASOSABS 0.1 07/08/2022 1145    Baseline Immunosuppressant Therapy Labs TB GOLD    Latest Ref Rng & Units 08/16/2022    9:30 AM  Quantiferon TB Gold  Quantiferon TB Gold Plus NEGATIVE NEGATIVE    Hepatitis Panel    Latest Ref Rng & Units  08/16/2022    9:30 AM  Hepatitis  Hep B Surface Ag NON-REACTIVE NON-REACTIVE   Hep B IgM NON-REACTIVE NON-REACTIVE   Hep C Ab NON-REACTIVE NON-REACTIVE    HIV Lab Results  Component Value Date   HIV NON-REACTIVE 08/16/2022   Immunoglobulins   SPEP    Latest Ref Rng & Units 09/16/2022    2:44 PM  Serum Protein Electrophoresis  Total Protein 6.1 - 8.1 g/dL 7.4    Chest x-ray: 08/16/2022 - No active cardiopulmonary disease.  Assessment/Plan:  Reviewed importance of holding HUMIRA with signs/symptoms of an infections, if antibiotics are prescribed to treat an active infection, and with invasive procedures.  Demonstrated proper injection technique with HUMIRA demo device  Patient able to demonstrate proper injection technique using the teach back method.  Patient self injected in the right upper thigh with:  Sample Medication: Humira 40/0.4 mL autoinjector pen NDC: [id number]: [id number]: 10/2023  Patient tolerated well.  Observed for 30 mins in office for adverse reaction and none noted.   Patient is to return in 1 month for labs and 6-8 weeks for follow-up appointment.  Standing orders placed for CBC and CMP which will be drawn at f/u appt.  [place] - she anticipates a dermatology appt in May/June 2024 when she is moving back to [facility].  HUMIRA approved through insurance .   Rx sent to: [facility]: [phone].  Patient provided with pharmacy phone number and advised to call later this week to schedule shipment to home.  Patient will continue HUMIRA 68m subcut every 14 days. She will continue hydroxychloroquine 200 mg twice daily, prednisone 7.5 mg daily and naproxen 3729mtwice daily  All questions encouraged and answered.  Instructed patient to call with any further questions or concerns.  DeKnox SalivaPharmD, MPH, BCPS, CPP Clinical  Pharmacist (Rheumatology and Pulmonology)  11/25/2022 9:15 AM

## 2022-11-25 NOTE — Patient Instructions (Signed)
Your next HUMIRA dose is due on 12/09/2022, 12/23/2022, and every 14 days thereafter  CONTINUE hydroxychloroquine 200 mg twice daily, prednisone 7.5 mg daily and naproxen for her oral medications in the meantime.   HOLD HUMIRA if you have signs or symptoms of an infection. You can resume once you feel better or back to your baseline. HOLD HUMIRA if you start antibiotics to treat an infection. HOLD HUMIRA around the time of surgery/procedures. Your surgeon will be able to provide recommendations on when to hold BEFORE and when you are cleared to [place].  Pharmacy information: Your prescription will be shipped from [facility]. Their phone number is [phone] Please call to schedule shipment and confirm address. They will mail your medication to your home.  Cost information: Your copay should be affordable. If you call the pharmacy and it is not affordable, please double-check that they are billing through your copay card as secondary coverage. That copay card information is: Issued: 11/18/2022 ID: [id number] GROUP: [id number] [id number] PCN: [id number]  Labs are due in 1 month then every 3 months. Lab hours are from Monday to Thursday 8am-12:30pm and 1pm-5pm and Friday 8am-12pm. You do not need an appointment if you come for labs during these times.  How to manage an injection site reaction: Remember the 5 C's: COUNTER - leave on the counter at least 30 minutes but up to overnight to bring medication to room temperature. This may help prevent stinging COLD - place something cold (like an ice gel pack or cold water bottle) on the injection site just before cleansing with alcohol. This may help reduce pain CLARITIN - use Claritin (generic name is loratadine) for the first two weeks of treatment or the day of, the day before, and the day after injecting. This will help to minimize injection site reactions CORTISONE CREAM - apply if injection site is irritated and itching CALL  ME - if injection site reaction is bigger than the size of your fist, looks infected, blisters, or if you develop hives

## 2022-12-02 ENCOUNTER — Other Ambulatory Visit: Payer: Self-pay

## 2022-12-02 MED ORDER — PREDNISONE 2.5 MG PO TABS: 7.5000 mg | ORAL_TABLET | Freq: Every day | ORAL | 0 refills | Status: DC

## 2022-12-02 NOTE — Addendum Note (Signed)
Addended by: Carole Binning on: 12/02/2022 08:47 AM   Modules accepted: Orders

## 2022-12-02 NOTE — Telephone Encounter (Signed)
Next Visit: 12/25/2022  Last Visit: 10/23/2022  Last Fill: 09/13/2022  Dx: Seropositive rheumatoid arthritis   Current Dose per office note on 10/23/2022: prednisone 7.5 mg daily   Okay to refill Prednisone?

## 2022-12-02 NOTE — Telephone Encounter (Signed)
Patient called stating that she needs a medication refill on prednisone.

## 2022-12-23 ENCOUNTER — Encounter: Payer: Self-pay | Admitting: Medical

## 2022-12-23 ENCOUNTER — Other Ambulatory Visit: Payer: Self-pay

## 2022-12-23 ENCOUNTER — Ambulatory Visit (INDEPENDENT_AMBULATORY_CARE_PROVIDER_SITE_OTHER): Payer: BC Managed Care – PPO | Admitting: Medical

## 2022-12-23 VITALS — BP 121/78 | HR 81 | Temp 95.8°F | Wt 174.0 lb

## 2022-12-23 DIAGNOSIS — J0300 Acute streptococcal tonsillitis, unspecified: Secondary | ICD-10-CM | POA: Diagnosis not present

## 2022-12-23 LAB — POCT RAPID STREP A (OFFICE): Rapid Strep A Screen: POSITIVE — AB

## 2022-12-23 MED ORDER — AMOXICILLIN 875 MG PO TABS: 875.0000 mg | ORAL_TABLET | Freq: Two times a day (BID) | ORAL | 0 refills | Status: AC

## 2022-12-23 NOTE — Progress Notes (Unsigned)
[facility] [place], [place] [address] Phone: [phone] Fax: [phone]   Office Visit Note  Patient Name: Glenda Kennedy  Date of [id number]  Med Rec number [id number]  Date of Service: 12/23/2022  Allergies: Patient has no known allergies.  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  Noted bad sore throat yesterday AM and has gotten worse. Has been painful to swallow. Has also had fatigue and chills. Some temperature fluctuations, has not checked temp. Some nasal congestion developed overnight last night. No cough. No HA, some myalgias (common for her with sickness/RA). Mild nausea last night. No vomiting or diarrhea.   Took Advil, last dose about 6 hrs ago.   Roommate had strep last week, does not share items.  On multiple immunosuppressive meds for rheumatoid arthritis including low dose of Prednisone daily. Due for Humira shot today.  Thinks she had mono few years ago.    Current Medication:  Outpatient Encounter Medications as of 12/23/2022  Medication Sig   Adalimumab (HUMIRA, 2 PEN,) 40 MG/0.4ML PNKT Inject 40 mg into the skin every 14 (fourteen) days. 1 kit - 2 pens   hydroxychloroquine (PLAQUENIL) 200 MG tablet Take 1 tablet (200 mg total) by mouth 2 (two) times daily.   levonorgestrel (KYLEENA) 19.5 MG IUD 1 each by Intrauterine route once.   naproxen (NAPROSYN) 375 MG tablet Take 1 tablet (375 mg total) by mouth 2 (two) times daily.   predniSONE (DELTASONE) 2.5 MG tablet Take 3 tablets (7.5 mg total) by mouth daily.   No facility-administered encounter medications on file as of 12/23/2022.      Medical History: Past Medical History:  Diagnosis Date   Anemia    Rheumatoid arthritis (HCC)      Vital Signs: BP 121/78   Pulse 81   Temp (!) 95.8 F (35.4 C) (Tympanic)   Wt 174 lb (78.9 kg)   SpO2 98%   BMI 26.46 kg/m    Review of Systems See  HPI  Physical Exam Vitals reviewed.  Constitutional:      General: She is not in acute distress.    Appearance: She is ill-appearing (mildly).  HENT:     Head: Normocephalic.     Right Ear: Ear canal and external ear normal.     Left Ear: Ear canal and external ear normal.     Ears:     Comments: TMs dull bilaterally, R>L    Nose: Mucosal edema (mild-moderate), congestion (mild) and rhinorrhea (cloudy) present.     Mouth/Throat:     Mouth: Mucous membranes are moist. No oral lesions.     Pharynx: Uvula midline. Posterior oropharyngeal erythema (mild-moderate) and uvula swelling (mild with mild erythema) present. No pharyngeal swelling.     Tonsils: Tonsillar exudate (small beige/white) present. 1+ on the right. 1+ on the left.     Comments: Mild erythema to tonsils Cardiovascular:     Rate and Rhythm: Normal rate and regular rhythm.     Heart sounds: No murmur heard.    No friction rub. No gallop.  Pulmonary:     Effort: Pulmonary effort is normal.     Breath sounds: Normal breath sounds. No wheezing, rhonchi or rales.  Musculoskeletal:     Cervical back: Neck supple. No rigidity.  Lymphadenopathy:     Cervical: Cervical adenopathy (1+ anterior nodes, mildly tender) present.  Neurological:     Mental Status:  She is alert.     Results for orders placed or performed in visit on 12/23/22 (from the past 24 hour(s))  POCT rapid strep A     Status: Abnormal   Collection Time: 12/23/22 10:35 AM  Result Value Ref Range   Rapid Strep A Screen Positive (A) Negative     Assessment/Plan: 1. Streptococcal tonsillopharyngitis Will treat with higher-dose Amoxicillin given inflammation noted to nose/right ear, immunosuppression and upcoming travel. Patient advised to contact rheumatologist regarding timing of Humira shot, discussed she should likely defer today's shot given illness. Discussed supportive and symptomatic treatment.  - POCT rapid strep A - amoxicillin (AMOXIL) 875 MG  tablet; Take 1 tablet (875 mg total) by mouth 2 (two) times daily for 10 days.  Dispense: 20 tablet; Refill: 0     General Counseling: Glenda Kennedy verbalizes understanding of the findings of todays visit and agrees with plan of treatment. she has been encouraged to call the office with any questions or concerns that should arise related to todays visit.   Time spent:20 [place] PA-C [place] 12/23/2022 10:16 AM

## 2022-12-24 NOTE — Patient Instructions (Signed)
-  Finish all antibiotics as prescribed, even when you are feeling better. Take antibiotic with food.   -Take an over-the-counter pain reliever (such as Tylenol) to help relieve pain or fever. You may take a decongestant (I.e Sudafed) as needed for congestion.  -Rest and drink plenty of water.  Drinking warm or cold liquids (such as tea, soup or smoothies) and eating soft foods (such as oatmeal) may be more comfortable until your throat pain improves.   -Do not share cups/water bottles/ utensils with others.  Wash your hands or use hand sanitizer often, especially before/after eating and after coughing into your hand or blowing your nose.   -Send MyChart message to provider or schedule return appointment as needed for new/worsening symptoms (such as increased throat pain or difficulty swallowing) or if your symptoms are not improving after 2-3 days on the antibiotic.    -I recommend that you call your rheumatologist about your Humira injection. You should likely postpone this until you are feeling better.

## 2022-12-25 ENCOUNTER — Encounter: Payer: Self-pay | Admitting: Internal Medicine

## 2022-12-25 ENCOUNTER — Ambulatory Visit: Payer: BC Managed Care – PPO | Attending: Internal Medicine | Admitting: Internal Medicine

## 2022-12-25 VITALS — BP 103/69 | HR 69 | Resp 14 | Ht 68.0 in | Wt 175.0 lb

## 2022-12-25 DIAGNOSIS — Z7952 Long term (current) use of systemic steroids: Secondary | ICD-10-CM

## 2022-12-25 DIAGNOSIS — Z79899 Other long term (current) drug therapy: Secondary | ICD-10-CM

## 2022-12-25 DIAGNOSIS — M059 Rheumatoid arthritis with rheumatoid factor, unspecified: Secondary | ICD-10-CM | POA: Diagnosis not present

## 2022-12-25 NOTE — Progress Notes (Signed)
Office Visit Note  Patient: Glenda Kennedy             Date of Birth: 01-12-2001           MRN: [id number]             PCP: System, Provider Not In Referring: No ref. provider found Visit Date: 12/25/2022   Subjective:  Follow-up   History of Present Illness: Glenda Kennedy is a 22 y.o. 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 She was found to have strep pharyngitis on Monday and started on amoxicillin for this with symptom improvement. So far joint symptoms have not significantly improved. Right knee effusion redeveloped within about 1 week after aspiration and injection in January. Right elbow is worst problem area.  Previous HPI 10/23/22 Glenda Kennedy is a 22 y.o. female here for follow up for seropositive RA on HCQ 400 mg daily, prednisone 7.5 mg daily, naproxen 375 mg BID, and started methotrexate 15 mg p.o. weekly after visit in November. So far she did not see any significant improvement in symptoms with addition of methotrexate.  Not having any particular intolerance either but still with joint pain stiffness and visible swelling and on some days severely limiting her mobility or ability to carry out a normal class load.   Previous HPI 08/16/22  Glenda Kennedy is a 22 y.o. 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 She had previous evaluation for to pain and swelling in her right knee and elbow during the past year. She had multiple visits with primary care and orthopedics clinic without much follow up at any particular site due to travelling a lot. Also had ED evaluation with distal right leg swelling found to be ruptured baker's cyst and finally prompted MRI study. Imaging also showing right knee meniscal tear. This was not thought to explain all inflammatory symptoms and serology testing positive with RF 77, CCP 264.3, and CRP of 64.1. Negative testing  for Lyme and negative ANA. She had fluid aspirated from the knee with inflammatory fluid without evidence of infection. Initial treatment with prednisone taper improved most joints incomplete improvement in elbow and knee. Treatment started with HCQ 400 mg daily and prednisone at 7.5 mg daily. Repeated knee steroid injection in August. She was recommended to consider biologic DMARD but not started due to anticipated relocation need for follow up plan. Currently she is also taking naproxen and symptoms are about average today, with some generalized pain but mostly right elbow and knee.   DMARDs Humira 2024-current HCQ 2023-current MTX 2023 d/c lack of benefit Prednisone 7.5 mg daily Naproxen 375 mg BID   Labs RF 77 CCP 264.2 ANA negative  04/01/22 Synovial Fluid WBCs 16,335 RBCs 859 No crystals Negative Cx Negative stain 79% neutrophils   Review of Systems  Constitutional:  Positive for fatigue.  HENT:  Negative for mouth sores and mouth dryness.   Eyes:  Negative for dryness.  Respiratory:  Negative for shortness of breath.   Cardiovascular:  Negative for chest pain and palpitations.  Gastrointestinal:  Negative for blood in stool, constipation and diarrhea.  Endocrine: Negative for increased urination.  Genitourinary:  Negative for involuntary urination.  Musculoskeletal:  Positive for joint pain, joint pain, joint swelling, myalgias, muscle weakness, morning stiffness, muscle tenderness and myalgias. Negative for gait problem.  Skin:  Negative  for color change, rash, hair loss and sensitivity to sunlight.  Allergic/Immunologic: Positive for susceptible to infections.  Neurological:  Positive for dizziness and headaches.  Hematological:  Positive for swollen glands.  Psychiatric/Behavioral:  Positive for sleep disturbance. Negative for depressed mood. The patient is nervous/anxious.     PMFS History:  Patient Active Problem List   Diagnosis Date Noted   Seropositive  rheumatoid arthritis ([place]) 08/16/2022   High risk medication use 08/16/2022   Ruptured Bakers cyst 08/16/2022   Vitamin D deficiency 08/16/2022   Long term current use of systemic steroids 08/16/2022   Screening for viral disease 08/16/2022    Past Medical History:  Diagnosis Date   Anemia    Rheumatoid arthritis ([place])     Family History  Problem Relation Age of Onset   Healthy Mother    Healthy Father    Healthy Sister    Healthy Brother    History reviewed. No pertinent surgical history. Social History   Social History Narrative   Not on file    There is no immunization history on file for this patient.   Objective: Vital Signs: BP 103/69 (BP Location: Left Arm, Patient Position: Sitting, Cuff Size: Normal)   Pulse 69   Resp 14   Ht '5\' 8"'$  (1.727 m)   Wt 175 lb (79.4 kg)   BMI 26.61 kg/m    Physical Exam Cardiovascular:     Rate and Rhythm: Normal rate and regular rhythm.  Pulmonary:     Effort: Pulmonary effort is normal.     Breath sounds: Normal breath sounds.  Skin:    General: Skin is warm and dry.  Neurological:     Mental Status: She is alert.  Psychiatric:        Mood and Affect: Mood normal.      Musculoskeletal Exam:  Shoulders full ROM no tenderness or swelling Right elbow extension limited by about 20 or 30 degrees, soft tissue swelling with tenderness to pressure Wrists full ROM no tenderness or swelling Fingers full ROM no tenderness or swelling Knees full ROM right knee moderate effusion with minimal tenderness   CDAI Exam: CDAI Score: 12  Patient Global: 40 mm; Provider Global: 40 mm Swollen: 2 ; Tender: 2  Joint Exam 12/25/2022      Right  Left  Elbow  Swollen Tender     Knee  Swollen Tender        Investigation: No additional findings.  Imaging: No results found.  Recent Labs: Lab Results  Component Value Date   WBC 8.0 09/16/2022   HGB 11.3 (L) 09/16/2022   PLT 346 09/16/2022   NA 138 09/16/2022   K 4.5  09/16/2022   CL 102 09/16/2022   CO2 26 09/16/2022   GLUCOSE 121 (H) 09/16/2022   BUN 11 09/16/2022   CREATININE 0.71 09/16/2022   BILITOT 0.4 09/16/2022   ALKPHOS 92 07/08/2022   AST 13 09/16/2022   ALT 8 09/16/2022   PROT 7.4 09/16/2022   ALBUMIN 4.7 07/08/2022   CALCIUM 9.5 09/16/2022   QFTBGOLDPLUS NEGATIVE 08/16/2022    Speciality Comments: Humira started 11/25/2022  Procedures:  No procedures performed Allergies: Patient has no known allergies.   Assessment / Plan:     Visit Diagnoses: Seropositive rheumatoid arthritis ([place]) - Plan: Sedimentation rate, C-reactive protein  Still active disease mostly in the knee and elbow. She is more bothered by the mobility limitation than joint pain. Need to give a long treatment duration to see efficacy  or not. Discussed if not improving more by next visit maybe switch to JAK inhibitor and maybe elbow injection. Continuing HCQ 400 mg daily, prednisone 7.5 mg daily, Humira 40 mg [place] q14days.  Long term current use of systemic steroids  Continuing on low dose prednisone 7.5 mg daily for now. Goal to achieve better disease control before any further taper.  High risk medication use - Plan: CBC with Differential/Platelet, COMPLETE METABOLIC PANEL WITH GFR  Checking CBC and CMP for medication monitoring on humira. No injection reactions. Did get strep throat but not sure related, will monitor for any repeat URI.  Orders: Orders Placed This Encounter  Procedures   Sedimentation rate   C-reactive protein   CBC with Differential/Platelet   COMPLETE METABOLIC PANEL WITH GFR   No orders of the defined types were placed in this encounter.    Follow-Up Instructions: Return in about 2 months (around 02/24/2023) for RA on ADA/HCQ/GC f/u 38mo.   CCollier Salina MD  Note - This record has been created using [employer]  Chart creation errors have been sought, but may not always  have been located. Such creation errors do not  reflect on  the standard of medical care.

## 2022-12-26 LAB — COMPLETE METABOLIC PANEL WITH GFR
AG Ratio: 1.8 (calc) (ref 1.0–2.5)
ALT: 10 U/L (ref 6–29)
AST: 13 U/L (ref 10–30)
Albumin: 4.2 g/dL (ref 3.6–5.1)
Alkaline phosphatase (APISO): 67 U/L (ref 31–125)
BUN: 13 mg/dL (ref 7–25)
CO2: 28 mmol/L (ref 20–32)
Calcium: 9.5 mg/dL (ref 8.6–10.2)
Chloride: 106 mmol/L (ref 98–110)
Creat: 0.58 mg/dL (ref 0.50–0.96)
Globulin: 2.4 g/dL (calc) (ref 1.9–3.7)
Glucose, Bld: 68 mg/dL (ref 65–99)
Potassium: 4.7 mmol/L (ref 3.5–5.3)
Sodium: 142 mmol/L (ref 135–146)
Total Bilirubin: 0.3 mg/dL (ref 0.2–1.2)
Total Protein: 6.6 g/dL (ref 6.1–8.1)
eGFR: 132 mL/min/{1.73_m2} (ref >=60)

## 2022-12-26 LAB — CBC WITH DIFFERENTIAL/PLATELET
Absolute Monocytes: 470 cells/uL (ref 200–950)
Basophils Absolute: 58 cells/uL (ref 0–200)
Basophils Relative: 0.6 %
Eosinophils Absolute: 144 cells/uL (ref 15–500)
Eosinophils Relative: 1.5 %
HCT: 34.7 % — ABNORMAL LOW (ref 35.0–45.0)
Hemoglobin: 11.4 g/dL — ABNORMAL LOW (ref 11.7–15.5)
Lymphs Abs: 1190 cells/uL (ref 850–3900)
MCH: 29.5 pg (ref 27.0–33.0)
MCHC: 32.9 g/dL (ref 32.0–36.0)
MCV: 89.7 fL (ref 80.0–100.0)
MPV: 10.9 fL (ref 7.5–12.5)
Monocytes Relative: 4.9 %
Neutro Abs: 7738 cells/uL (ref 1500–7800)
Neutrophils Relative %: 80.6 %
Platelets: 279 10*3/uL (ref 140–400)
RBC: 3.87 10*6/uL (ref 3.80–5.10)
RDW: 13 % (ref 11.0–15.0)
Total Lymphocyte: 12.4 %
WBC: 9.6 10*3/uL (ref 3.8–10.8)

## 2022-12-26 LAB — C-REACTIVE PROTEIN: CRP: 19.7 mg/L — ABNORMAL HIGH (ref <8.0)

## 2022-12-26 LAB — SEDIMENTATION RATE: Sed Rate: 11 mm/h (ref 0–20)

## 2022-12-27 NOTE — Progress Notes (Signed)
Sedimentation rate is 11 improved from 25.  CRP is 19.7 improved from 48.  These are encouraging that inflammation is partially improved even though she still has not seen a great change in her joints yet. Blood count and metabolic panel look fine.

## 2022-12-28 ENCOUNTER — Other Ambulatory Visit: Payer: Self-pay | Admitting: Internal Medicine

## 2022-12-28 DIAGNOSIS — M059 Rheumatoid arthritis with rheumatoid factor, unspecified: Secondary | ICD-10-CM

## 2022-12-30 ENCOUNTER — Encounter: Payer: Self-pay | Admitting: *Deleted

## 2022-12-30 MED ORDER — HYDROXYCHLOROQUINE SULFATE 200 MG PO TABS: 200.0000 mg | ORAL_TABLET | Freq: Two times a day (BID) | ORAL | 2 refills | Status: AC

## 2022-12-30 NOTE — Telephone Encounter (Signed)
Next Visit: 02/17/2023  Last Visit: 12/25/2022  Labs: 12/25/2022 Sedimentation rate is 11 improved from 25.  CRP is 19.7 improved from 48. Blood count and metabolic panel look fine.   Eye exam: not on file. Sent my chart message to advise patient we need PLQ eye exam.    Current Dose per office note 12/25/2022: HCQ 400 mg daily   [id number] rheumatoid arthritis   Last Fill: 09/13/2022  Okay to refill Plaquenil?

## 2023-01-15 ENCOUNTER — Other Ambulatory Visit: Payer: Self-pay | Admitting: Internal Medicine

## 2023-01-15 DIAGNOSIS — M059 Rheumatoid arthritis with rheumatoid factor, unspecified: Secondary | ICD-10-CM

## 2023-01-16 ENCOUNTER — Other Ambulatory Visit: Payer: Self-pay

## 2023-01-16 ENCOUNTER — Other Ambulatory Visit: Payer: Self-pay | Admitting: Internal Medicine

## 2023-01-16 DIAGNOSIS — M059 Rheumatoid arthritis with rheumatoid factor, unspecified: Secondary | ICD-10-CM

## 2023-01-16 NOTE — Telephone Encounter (Signed)
Last Fill: 09/13/2022 Naproxen 11/25/2022 Prednisone  Labs: 12/25/2022 Sedimentation rate is 11 improved from 25.  CRP is 19.7 improved from 48.  These are encouraging that inflammation is partially improved even though she still has not seen a great change in her joints yet. Blood count and metabolic panel look fine.  Next Visit: 02/17/2023  Last Visit: 12/25/2022  DX: Seropositive rheumatoid arthritis   Current Dose per office note 12/25/2022: prednisone 7.5 mg daily, Naproxen not mentioned  Okay to refill Naproxen and Prednisone?

## 2023-01-17 MED ORDER — PREDNISONE 2.5 MG PO TABS: 7.5000 mg | ORAL_TABLET | Freq: Every day | ORAL | 0 refills | Status: AC

## 2023-01-17 MED ORDER — NAPROXEN 375 MG PO TABS: 375.0000 mg | ORAL_TABLET | Freq: Two times a day (BID) | ORAL | 2 refills | Status: AC

## 2023-01-22 ENCOUNTER — Other Ambulatory Visit: Payer: Self-pay | Admitting: Rheumatology

## 2023-01-22 ENCOUNTER — Ambulatory Visit
Admission: RE | Admit: 2023-01-22 | Discharge: 2023-01-22 | Disposition: A | Discharge status: Home / Self Care | Payer: BC Managed Care – PPO | Source: Ambulatory Visit | Attending: Rheumatology | Admitting: Rheumatology

## 2023-01-22 ENCOUNTER — Encounter: Payer: Self-pay | Admitting: Physician Assistant

## 2023-01-22 ENCOUNTER — Ambulatory Visit
Admission: RE | Admit: 2023-01-22 | Discharge: 2023-01-22 | Disposition: A | Discharge status: Home / Self Care | Payer: BC Managed Care – PPO | Attending: Physician Assistant | Admitting: Physician Assistant

## 2023-01-22 ENCOUNTER — Encounter: Payer: Self-pay | Admitting: Rheumatology

## 2023-01-22 DIAGNOSIS — M059 Rheumatoid arthritis with rheumatoid factor, unspecified: Secondary | ICD-10-CM | POA: Insufficient documentation

## 2023-01-22 DIAGNOSIS — M069 Rheumatoid arthritis, unspecified: Secondary | ICD-10-CM

## 2023-02-05 ENCOUNTER — Telehealth: Payer: Self-pay | Admitting: Internal Medicine

## 2023-02-05 NOTE — Telephone Encounter (Signed)
Since this is the fifth injection, I am not sure if it is true injection reaction or likely to occur again.  Recommend patient ice the area for 15 min, 5 min off and repeat as needed - this should help with redness and warmth. She may have to repeat several times though to get benefit.  If there is itchiness, she can apply OTC cortisone cream. Otherwise if not painful or bothersome, the lump generally self-resolves with some icing to speed it along. Sometimes the injection can penetrate at a <90 degree angle that can cause needle to not penetrate as deeply as expected.   It appears she has f/u with Dr. Dimple Casey on 02/20/2023. She should plan to take picture of injection site today to show Dr. Dimple Casey at [facility] as well  Chesley Mires, PharmD, MPH, BCPS, CPP Clinical Pharmacist (Rheumatology and Pulmonology)

## 2023-02-05 NOTE — Telephone Encounter (Signed)
Attempted to contact the patient and left a message to call the office back. 

## 2023-02-05 NOTE — Telephone Encounter (Signed)
Patient called stating she took her Humira yesterday, 02/04/23 at 1:00 pm and has developed a reaction at the injection site.  Patient states the lump is warm to touch and has redness.  Patient state it is not painful.  Patient states this is her 5th injection and has never had a reaction before.  Patient requested a return call.

## 2023-02-06 NOTE — Telephone Encounter (Signed)
Patient advised Since this is the fifth injection, the Pharmacist is not sure if it is true injection reaction or likely to occur again.   Recommend patient ice the area for 15 min, 5 min off and repeat as needed - this should help with redness and warmth. She may have to repeat several times though to get benefit.   If there is itchiness, she can apply OTC cortisone cream. Otherwise if not painful or bothersome, the lump generally self-resolves with some icing to speed it along. Sometimes the injection can penetrate at a <90 degree angle that can cause needle to not penetrate as deeply as expected.    It appears she has f/u with Dr. Dimple Casey on 02/20/2023. She should plan to take picture of injection site today to show Dr. Dimple Casey at [facility] as well. Patient states she does have pictures to show Dr. Dimple Casey. Patient verbalized understanding.

## 2023-02-10 ENCOUNTER — Other Ambulatory Visit: Payer: Self-pay | Admitting: Internal Medicine

## 2023-02-10 DIAGNOSIS — Z79899 Other long term (current) drug therapy: Secondary | ICD-10-CM

## 2023-02-10 DIAGNOSIS — M059 Rheumatoid arthritis with rheumatoid factor, unspecified: Secondary | ICD-10-CM

## 2023-02-10 NOTE — Telephone Encounter (Signed)
Last Fill: 11/25/2022  Labs: 12/25/2022 Sedimentation rate is 11 improved from 25.  CRP is 19.7 improved from 48.  These are encouraging that inflammation is partially improved even though she still has not seen a great change in her joints yet. Blood count and metabolic panel look fine.  TB Gold: 08/16/2022 Negative   Next Visit: 02/20/2023  Last Visit: 12/25/2022  [id number] rheumatoid arthritis   Current Dose per office note 12/25/2022: Humira 40 mg [place] q14days.   Okay to refill Humira?

## 2023-02-12 ENCOUNTER — Ambulatory Visit (INDEPENDENT_AMBULATORY_CARE_PROVIDER_SITE_OTHER): Payer: BC Managed Care – PPO | Admitting: Family Medicine

## 2023-02-12 ENCOUNTER — Telehealth: Payer: Self-pay | Admitting: Internal Medicine

## 2023-02-12 ENCOUNTER — Encounter: Payer: Self-pay | Admitting: Family Medicine

## 2023-02-12 VITALS — BP 122/70 | HR 110 | Temp 98.3°F | Ht 68.0 in | Wt 178.0 lb

## 2023-02-12 DIAGNOSIS — R21 Rash and other nonspecific skin eruption: Secondary | ICD-10-CM | POA: Diagnosis not present

## 2023-02-12 NOTE — Progress Notes (Signed)
[facility] [address] [place], [place] [address] Phone: [phone] Fax: [phone]   Office Visit Note  Patient Name: Glenda Kennedy  Date of [id number]  Med Rec number [id number]  Date of Service: 02/12/2023  Patient has no known allergies.  Chief Complaint  Patient presents with   Rash     Saturday morning itchy below chin and continued to itch and spreading up her face Spots seem to be acneiform but not acne  No recent changes in medication - has RA  Had formal Friday night so wore more makeup than usual - used Micellar water to remove makeup - end of bottle and was about a year old.  No change in hand creams, no new nail products  Lesions are a burning type of itch and seem to have very fragile surface - break open with minimal physical irritation and leave a shallow ulcer  Rash Pertinent negatives include no fever.      Current Medication:  Outpatient Encounter Medications as of 02/12/2023  Medication Sig   HUMIRA, 2 PEN, 40 MG/0.4ML PNKT INJECT 40 MG (0.4 ML) UNDER THE SKIN EVERY 14 DAYS.   hydroxychloroquine (PLAQUENIL) 200 MG tablet Take 1 tablet (200 mg total) by mouth 2 (two) times daily.   levonorgestrel (KYLEENA) 19.5 MG IUD 1 each by Intrauterine route once.   naproxen (NAPROSYN) 375 MG tablet Take 1 tablet (375 mg total) by mouth 2 (two) times daily.   predniSONE (DELTASONE) 2.5 MG tablet Take 3 tablets (7.5 mg total) by mouth daily.   No facility-administered encounter medications on file as of 02/12/2023.      Medical History: Past Medical History:  Diagnosis Date   Anemia    Rheumatoid arthritis (HCC)      Vital Signs: BP 122/70   Pulse (!) 110   Temp 98.3 F (36.8 C) (Tympanic)   Ht 5\' 8"  (1.727 m)   Wt 178 lb (80.7 kg)   SpO2 99%   BMI 27.06 kg/m    Review of Systems  Constitutional:  Negative for fever.  Skin:  Positive for rash.    Physical Exam Vitals reviewed.  Constitutional:      Appearance: Normal  appearance.  Skin:    Findings: Rash present. Rash is papular.     Comments: Facial rash with scattered papular (not pustular or vesicular) lesions and shallow ulceration where they have broken down.  Neurological:     Mental Status: She is alert.       Assessment/Plan:  1. Rash and nonspecific skin eruption Suspect may be reaction to the micellar water. Increase zyrtec to two times daily for one week. Use water to wash off facial cleanser for the next week instead of any cotton pads.  I will check in with Glenda Kennedy on Monday to see how she is doing and she will let me know if anything changes in the interim If things worsen I will send to dermatology      General Counseling: Glenda Kennedy verbalizes understanding of the findings of todays visit and agrees with plan of treatment. I have discussed any further diagnostic evaluation that may be needed or ordered today. We also reviewed her medications today. she has been encouraged to call the office with any questions or concerns that should arise related to todays visit.   No orders of the defined types were placed in this encounter.   No orders of the defined types were placed in this encounter.    Dr Durwin Reges  E. [facility] Physician

## 2023-02-12 NOTE — Telephone Encounter (Signed)
Patient left a voicemail stating she might be having an allergic reaction to one of the medications she is taking.  Patient states she has a rash that is rapidly spreading and requested a return call.

## 2023-02-12 NOTE — Telephone Encounter (Signed)
Patient is prescribed Prednisone, Naproxen, Plaquenil, and Humira from our office. Contacted the patient and the patient states she has a rash on her face that keeps spreading. Patient states she believes it is related to the Humira or Prednisone. Patient states she has a health center appointment this afternoon. Patient call back number is [phone]. Please advise.

## 2023-02-12 NOTE — Telephone Encounter (Signed)
I spoke with Glenda Kennedy reports skin rash starting since Saturday on her chin has spread to affect more of her face since that time.  Describes raised bumps sounds like papules or pustules not a confluent rash or wheals.  Has been spending some more time out in the sun recently questions if this could be a trigger.  No other recent medication changes.  She is on medications that could contribute to photosensitivity.  Acne rash related to steroid use not likely without any recent dose change.  Does not sound likely for a true allergy or hypersensitivity reaction needing change in medication today.  She is also seeing the [facility] later today for evaluation.

## 2023-02-17 ENCOUNTER — Encounter: Payer: Self-pay | Admitting: Family Medicine

## 2023-02-17 ENCOUNTER — Other Ambulatory Visit: Payer: Self-pay | Admitting: Internal Medicine

## 2023-02-17 ENCOUNTER — Ambulatory Visit: Payer: BC Managed Care – PPO | Admitting: Internal Medicine

## 2023-02-17 DIAGNOSIS — M059 Rheumatoid arthritis with rheumatoid factor, unspecified: Secondary | ICD-10-CM

## 2023-02-19 NOTE — Progress Notes (Deleted)
Office Visit Note  Patient: Glenda Kennedy             Date of Birth: [birth year]           MRN: [id number]             PCP: System, Provider Not In Referring: No ref. provider found Visit Date: 02/20/2023   Subjective:  No chief complaint on file.   History of Present Illness: Glenda Kennedy is a 22 y.o. female here for follow up ***   Previous HPI 12/25/22 Glenda Kennedy is a 22 y.o. female here for follow up for seropositive RA on HCQ 400 mg daily, prednisone 7.5 mg daily, and Humira 40 mg [place] q14 days taken 3 doses so far. She was found to have strep pharyngitis on Monday and started on amoxicillin for this with symptom improvement. So far joint symptoms have not significantly improved. Right knee effusion redeveloped within about 1 week after aspiration and injection in January. Right elbow is worst problem area.   Previous HPI 10/23/22 Glenda Kennedy is a 22 y.o. female here for follow up for seropositive RA on HCQ 400 mg daily, prednisone 7.5 mg daily, naproxen 375 mg BID, and started methotrexate 15 mg p.o. weekly after visit in November. So far she did not see any significant improvement in symptoms with addition of methotrexate.  Not having any particular intolerance either but still with joint pain stiffness and visible swelling and on some days severely limiting her mobility or ability to carry out a normal class load.   Previous HPI 08/16/22  Glenda Kennedy is a 22 y.o. female here for seropositive RA due to moving to this area from [place]. She had previous evaluation for to pain and swelling in her right knee and elbow during the past year. She had multiple visits with primary care and orthopedics clinic without much follow up at any particular site due to travelling a lot. Also had ED evaluation with distal right leg swelling found to be ruptured baker's cyst and finally prompted MRI study. Imaging also showing right knee meniscal tear. This was not thought to explain all  inflammatory symptoms and serology testing positive with RF 77, CCP 264.3, and CRP of 64.1. Negative testing for Lyme and negative ANA. She had fluid aspirated from the knee with inflammatory fluid without evidence of infection. Initial treatment with prednisone taper improved most joints incomplete improvement in elbow and knee. Treatment started with HCQ 400 mg daily and prednisone at 7.5 mg daily. Repeated knee steroid injection in August. She was recommended to consider biologic DMARD but not started due to anticipated relocation need for follow up plan. Currently she is also taking naproxen and symptoms are about average today, with some generalized pain but mostly right elbow and knee.   DMARDs Humira 2024-current HCQ 2023-current MTX 2023 d/c lack of benefit Prednisone 7.5 mg daily Naproxen 375 mg BID   Labs RF 77 CCP 264.2 ANA negative  04/01/22 Synovial Fluid WBCs 16,335 RBCs 859 No crystals Negative Cx Negative stain 79% neutrophils   No Rheumatology ROS completed.   PMFS History:  Patient Active Problem List   Diagnosis Date Noted   Seropositive rheumatoid arthritis (HCC) 08/16/2022   High risk medication use 08/16/2022   Ruptured Bakers cyst 08/16/2022   Vitamin D deficiency 08/16/2022   Long term current use of systemic steroids 08/16/2022   Screening for viral disease 08/16/2022    Past Medical History:  Diagnosis Date   Anemia  Rheumatoid arthritis (HCC)     Family History  Problem Relation Age of Onset   Healthy Mother    Healthy Father    Healthy Sister    Healthy Brother    No past surgical history on file. Social History   Social History Narrative   Not on file    There is no immunization history on file for this patient.   Objective: Vital Signs: There were no vitals taken for this visit.   Physical Exam   Musculoskeletal Exam: ***  CDAI Exam: CDAI Score: -- Patient Global: --; Provider Global: -- Swollen: --; Tender: -- Joint  Exam 02/20/2023   No joint exam has been documented for this visit   There is currently no information documented on the homunculus. Go to the Rheumatology activity and complete the homunculus joint exam.  Investigation: No additional findings.  Imaging: DG ELBOW COMPLETE RIGHT (3+VIEW)  Result Date: 01/24/2023 CLINICAL DATA:  Rheumatoid arthritis. EXAM: RIGHT ELBOW - COMPLETE 3+ VIEW COMPARISON:  Right elbow radiographs 08/16/2022 FINDINGS: Mildly progressive now moderate to severe medial elbow joint space narrowing with near bone-on-bone contact at the medial aspect of the trochlea and coronoid process. Mild subchondral cystic change and minimal peripheral spurring in this region. The radiocapitellar joint space appears maintained. There is again elevation of the distal anterior humeral fat pad and visualization of the distal posterior fat pad indicating an elbow joint effusion. No cortical erosion.  No acute fracture or dislocation. IMPRESSION: 1. Mildly progressive now moderate to severe medial elbow joint space narrowing. 2. Persistent elbow joint effusion. 3. No cortical erosion to indicate radiographic evidence of a more advanced inflammatory arthropathy. Electronically Signed   By: Neita Garnet M.D.   On: 01/24/2023 09:35   DG Knee Complete 4 Views Right  Result Date: 01/22/2023 CLINICAL DATA:  Pain EXAM: RIGHT KNEE - COMPLETE 4+ VIEW COMPARISON:  None Available. FINDINGS: There is a small joint effusion. No fracture or dislocation. Lucency in the lateral most aspect of the tibial plateau. No other abnormalities. IMPRESSION: 1. Small joint effusion. 2. Rounded lucency in the lateral most aspect of the tibial plateau is nonspecific. Given history of rheumatoid arthritis, a bony erosion could have this appearance. 3. No other abnormalities.  No joint space loss. Electronically Signed   By: Gerome Sam III M.D.   On: 01/22/2023 19:33    Recent Labs: Lab Results  Component Value Date    WBC 9.6 12/25/2022   HGB 11.4 (L) 12/25/2022   PLT 279 12/25/2022   NA 142 12/25/2022   K 4.7 12/25/2022   CL 106 12/25/2022   CO2 28 12/25/2022   GLUCOSE 68 12/25/2022   BUN 13 12/25/2022   CREATININE 0.58 12/25/2022   BILITOT 0.3 12/25/2022   ALKPHOS 92 07/08/2022   AST 13 12/25/2022   ALT 10 12/25/2022   PROT 6.6 12/25/2022   ALBUMIN 4.7 07/08/2022   CALCIUM 9.5 12/25/2022   QFTBGOLDPLUS NEGATIVE 08/16/2022    Speciality Comments: Humira started 11/25/2022  Procedures:  No procedures performed Allergies: Patient has no known allergies.   Assessment / Plan:     Visit Diagnoses: Seropositive rheumatoid arthritis (HCC)  High risk medication use  ***  Orders: No orders of the defined types were placed in this encounter.  No orders of the defined types were placed in this encounter.    Follow-Up Instructions: No follow-ups on file.   Fuller Plan, MD  Note - This record has been created using Dragon  software.  Chart creation errors have been sought, but may not always  have been located. Such creation errors do not reflect on  the standard of medical care.

## 2023-02-20 ENCOUNTER — Ambulatory Visit: Payer: BC Managed Care – PPO | Admitting: Internal Medicine

## 2023-02-20 DIAGNOSIS — M059 Rheumatoid arthritis with rheumatoid factor, unspecified: Secondary | ICD-10-CM

## 2023-02-20 DIAGNOSIS — Z79899 Other long term (current) drug therapy: Secondary | ICD-10-CM

## 2023-02-25 ENCOUNTER — Ambulatory Visit: Payer: BC Managed Care – PPO | Attending: Internal Medicine | Admitting: Internal Medicine

## 2023-02-25 ENCOUNTER — Encounter: Payer: Self-pay | Admitting: Internal Medicine

## 2023-02-25 VITALS — BP 117/80 | HR 87 | Resp 12 | Ht 68.0 in | Wt 172.0 lb

## 2023-02-25 DIAGNOSIS — Z79899 Other long term (current) drug therapy: Secondary | ICD-10-CM

## 2023-02-25 DIAGNOSIS — M059 Rheumatoid arthritis with rheumatoid factor, unspecified: Secondary | ICD-10-CM | POA: Diagnosis not present

## 2023-02-25 LAB — CBC WITH DIFFERENTIAL/PLATELET
Basophils Absolute: 55 cells/uL (ref 0–200)
Eosinophils Absolute: 185 cells/uL (ref 15–500)
Eosinophils Relative: 1.7 %
Hemoglobin: 11.9 g/dL (ref 11.7–15.5)
MCH: 29.5 pg (ref 27.0–33.0)
MCHC: 32.6 g/dL (ref 32.0–36.0)
Neutro Abs: 8807 cells/uL — ABNORMAL HIGH (ref 1500–7800)
RBC: 4.03 10*6/uL (ref 3.80–5.10)
WBC: 10.9 10*3/uL — ABNORMAL HIGH (ref 3.8–10.8)

## 2023-02-25 LAB — SEDIMENTATION RATE: Sed Rate: 6 mm/h (ref 0–20)

## 2023-02-25 NOTE — Progress Notes (Signed)
Office Visit Note  Patient: Glenda Kennedy             Date of Birth: [birth year]           MRN: [id number]             PCP: System, Provider Not In Referring: No ref. provider found Visit Date: 02/25/2023   Subjective:  Follow-up (Patient states she had a rash all over her face last week. Patient states she still has joint pain in her right knee and right elbow.)   History of Present Illness: Glenda Kennedy is a 22 y.o. female here for follow up for seropositive RA on hydroxychloroquine 400 mg daily prednisone 7.5 mg daily, Humira 40 mg subcu q. 14 days.  We recently spoke regarding a new facial rash this is largely improving compared to when this was asked him last week.  [facility] was most suspected as being reaction to a skin product that she is discontinued.  Joint pain and swelling in most areas is doing better still has trouble specifically in the right elbow and right knee.  Considerable size effusions with decreased range of motion.  She had updated x-rays checked for this with her rheumatologist in [place] that did not show significant erosions or structural change.  She had a intra-articular steroid injection to the right elbow that helped for about 2 or 3 days.  Previous HPI 12/25/22 Glenda Kennedy is a 22 y.o. female here for follow up for seropositive RA on HCQ 400 mg daily, prednisone 7.5 mg daily, and Humira 40 mg Shambaugh q14 days taken 3 doses so far. She was found to have strep pharyngitis on Monday and started on amoxicillin for this with symptom improvement. So far joint symptoms have not significantly improved. Right knee effusion redeveloped within about 1 week after aspiration and injection in January. Right elbow is worst problem area.   Previous HPI 10/23/22 Glenda Kennedy is a 22 y.o. female here for follow up for seropositive RA on HCQ 400 mg daily, prednisone 7.5 mg daily, naproxen 375 mg BID, and started methotrexate 15 mg p.o.  weekly after visit in November. So far she did not see any significant improvement in symptoms with addition of methotrexate.  Not having any particular intolerance either but still with joint pain stiffness and visible swelling and on some days severely limiting her mobility or ability to carry out a normal class load.   Previous HPI 08/16/22  Glenda Kennedy is a 22 y.o. female here for seropositive RA due to moving to this area from [place]. She had previous evaluation for to pain and swelling in her right knee and elbow during the past year. She had multiple visits with primary care and orthopedics clinic without much follow up at any particular site due to travelling a lot. Also had ED evaluation with distal right leg swelling found to be ruptured baker's cyst and finally prompted MRI study. Imaging also showing right knee meniscal tear. This was not thought to explain all inflammatory symptoms and serology testing positive with RF 77, CCP 264.3, and CRP of 64.1. Negative testing for Lyme and negative ANA. She had fluid aspirated from the knee with inflammatory fluid without evidence of infection. Initial treatment with prednisone taper improved most joints incomplete improvement in elbow and knee. Treatment started with HCQ 400 mg daily and prednisone at 7.5 mg daily. Repeated knee steroid injection in August. She was recommended to consider biologic  DMARD but not started due to anticipated relocation need for follow up plan. Currently she is also taking naproxen and symptoms are about average today, with some generalized pain but mostly right elbow and knee.   DMARDs Humira 2024-current HCQ 2023-current MTX 2023 d/c lack of benefit Prednisone 7.5 mg daily Naproxen 375 mg BID   Labs RF 77 CCP 264.2 ANA negative  04/01/22 Synovial Fluid WBCs 16,335 RBCs 859 No crystals Negative Cx Negative stain 79% neutrophils   Review of Systems  Constitutional:  Positive for fatigue.  HENT:   Negative for mouth sores and mouth dryness.   Eyes:  Negative for dryness.  Respiratory:  Negative for shortness of breath.   Cardiovascular:  Positive for palpitations. Negative for chest pain.  Gastrointestinal:  Negative for blood in stool, constipation and diarrhea.  Endocrine: Negative for increased urination.  Genitourinary:  Negative for involuntary urination.  Musculoskeletal:  Positive for joint pain, joint pain, joint swelling and morning stiffness. Negative for gait problem, myalgias, muscle weakness, muscle tenderness and myalgias.  Skin:  Positive for rash. Negative for color change, hair loss and sensitivity to sunlight.  Allergic/Immunologic: Positive for susceptible to infections.  Neurological:  Positive for dizziness and headaches.  Hematological:  Negative for swollen glands.  Psychiatric/Behavioral:  Negative for depressed mood and sleep disturbance. The patient is nervous/anxious.     PMFS History:  Patient Active Problem List   Diagnosis Date Noted   Seropositive rheumatoid arthritis (HCC) 08/16/2022   High risk medication use 08/16/2022   Ruptured Bakers cyst 08/16/2022   Vitamin D deficiency 08/16/2022   Long term current use of systemic steroids 08/16/2022   Screening for viral disease 08/16/2022    Past Medical History:  Diagnosis Date   Anemia    Rheumatoid arthritis (HCC)     Family History  Problem Relation Age of Onset   Healthy Mother    Healthy Father    Healthy Sister    Healthy Brother    History reviewed. No pertinent surgical history. Social History   Social History Narrative   Not on file    There is no immunization history on file for this patient.   Objective: Vital Signs: BP 117/80 (BP Location: Left Arm, Patient Position: Sitting, Cuff Size: Normal)   Pulse 87   Resp 12   Ht 5\' 8"  (1.727 m)   Wt 172 lb (78 kg)   BMI 26.15 kg/m    Physical Exam Cardiovascular:     Rate and Rhythm: Normal rate and regular rhythm.   Pulmonary:     Effort: Pulmonary effort is normal.     Breath sounds: Normal breath sounds.  Lymphadenopathy:     Cervical: No cervical adenopathy.  Skin:    General: Skin is warm and dry.     Findings: No rash.  Neurological:     Mental Status: She is alert.  Psychiatric:        Mood and Affect: Mood normal.      Musculoskeletal Exam:  Shoulders full ROM no tenderness or swelling Elbows right elbow effusion with some tenderness to pressure, extension range of motion restricted, left elbow normal Wrists full ROM no tenderness or swelling Fingers full ROM no tenderness or swelling Knees large right knee effusion and mild tenderness, range of motion very slightly restricted flexion, left knee normal   CDAI Exam: CDAI Score: 10  Patient Global: 30 mm; Provider Global: 30 mm Swollen: 2 ; Tender: 2  Joint Exam 02/25/2023  Right  Left  Elbow  Swollen Tender     Knee  Swollen Tender        Investigation: No additional findings.  Imaging: No results found.  Recent Labs: Lab Results  Component Value Date   WBC 10.9 (H) 02/25/2023   HGB 11.9 02/25/2023   PLT 240 02/25/2023   NA 141 02/25/2023   K 4.2 02/25/2023   CL 105 02/25/2023   CO2 28 02/25/2023   GLUCOSE 87 02/25/2023   BUN 10 02/25/2023   CREATININE 0.72 02/25/2023   BILITOT 0.5 02/25/2023   ALKPHOS 92 07/08/2022   AST 18 02/25/2023   ALT 10 02/25/2023   PROT 6.7 02/25/2023   ALBUMIN 4.7 07/08/2022   CALCIUM 9.4 02/25/2023   QFTBGOLDPLUS NEGATIVE 08/16/2022    Speciality Comments: Humira started 11/25/2022  Procedures:  No procedures performed Allergies: Patient has no known allergies.   Assessment / Plan:     Visit Diagnoses: Seropositive rheumatoid arthritis (HCC) - Plan: Sedimentation rate, C-reactive protein  Disease activity is doing really well except for 2 significantly swollen joints that have not responded much.  About 3 months into treatment on Humira so would expect to be seeing  most of the clinical benefit by now.  Will recheck sed rate and CRP for disease activity monitoring.  Plan to continue current treatment regimen with hydroxychloroquine 400 mg daily prednisone 7.5 mg daily and that Humira for now.  May benefit with considering change in treatment if is not any more progress than this could be a candidate for Jak inhibitors.  But she is graduating and will be living back in [place] before the due to for neck scheduled follow-up, so can discuss medication management with her rheumatologist there.  High risk medication use - Plan: CBC with Differential/Platelet, COMPLETE METABOLIC PANEL WITH GFR, Lipid panel  Checking CBC and CMP for medication monitoring on continued use of Humira.  Also discussed possibility of changing to different medication such as Jak inhibitor and checking lipid panel today.  Orders: Orders Placed This Encounter  Procedures   Sedimentation rate   C-reactive protein   CBC with Differential/Platelet   COMPLETE METABOLIC PANEL WITH GFR   Lipid panel   No orders of the defined types were placed in this encounter.    Follow-Up Instructions: No follow-ups on file.   Fuller Plan, MD  Note - This record has been created using [employer].  Chart creation errors have been sought, but may not always  have been located. Such creation errors do not reflect on  the standard of medical care.

## 2023-02-26 ENCOUNTER — Ambulatory Visit: Payer: BC Managed Care – PPO | Admitting: Internal Medicine

## 2023-02-26 LAB — CBC WITH DIFFERENTIAL/PLATELET
Absolute Monocytes: 469 cells/uL (ref 200–950)
Basophils Relative: 0.5 %
HCT: 36.5 % (ref 35.0–45.0)
Lymphs Abs: 1384 cells/uL (ref 850–3900)
MCV: 90.6 fL (ref 80.0–100.0)
MPV: 11.2 fL (ref 7.5–12.5)
Monocytes Relative: 4.3 %
Neutrophils Relative %: 80.8 %
Platelets: 240 10*3/uL (ref 140–400)
RDW: 12.3 % (ref 11.0–15.0)
Total Lymphocyte: 12.7 %

## 2023-02-26 LAB — LIPID PANEL
Cholesterol: 158 mg/dL (ref <200)
HDL: 80 mg/dL (ref >=50)
LDL Cholesterol (Calc): 61 mg/dL (calc)
Non-HDL Cholesterol (Calc): 78 mg/dL (calc) (ref <130)
Total CHOL/HDL Ratio: 2 (calc) (ref <5.0)
Triglycerides: 87 mg/dL (ref <150)

## 2023-02-26 LAB — C-REACTIVE PROTEIN: CRP: 7.5 mg/L (ref <8.0)

## 2023-02-26 LAB — COMPLETE METABOLIC PANEL WITH GFR
AG Ratio: 1.7 (calc) (ref 1.0–2.5)
ALT: 10 U/L (ref 6–29)
AST: 18 U/L (ref 10–30)
Albumin: 4.2 g/dL (ref 3.6–5.1)
Alkaline phosphatase (APISO): 69 U/L (ref 31–125)
BUN: 10 mg/dL (ref 7–25)
CO2: 28 mmol/L (ref 20–32)
Calcium: 9.4 mg/dL (ref 8.6–10.2)
Chloride: 105 mmol/L (ref 98–110)
Creat: 0.72 mg/dL (ref 0.50–0.96)
Globulin: 2.5 g/dL (calc) (ref 1.9–3.7)
Glucose, Bld: 87 mg/dL (ref 65–99)
Potassium: 4.2 mmol/L (ref 3.5–5.3)
Sodium: 141 mmol/L (ref 135–146)
Total Bilirubin: 0.5 mg/dL (ref 0.2–1.2)
Total Protein: 6.7 g/dL (ref 6.1–8.1)
eGFR: 121 mL/min/{1.73_m2} (ref >=60)

## 2023-03-10 ENCOUNTER — Other Ambulatory Visit: Payer: Self-pay | Admitting: Internal Medicine

## 2023-03-10 DIAGNOSIS — M059 Rheumatoid arthritis with rheumatoid factor, unspecified: Secondary | ICD-10-CM

## 2023-03-10 DIAGNOSIS — Z79899 Other long term (current) drug therapy: Secondary | ICD-10-CM

## 2023-03-11 NOTE — Telephone Encounter (Signed)
Last Fill: 02/10/2023  Labs: 02/25/2023  WBC 10.9 Neutro Abs 8,807  TB Gold: 08/16/2022 Negative   Next Visit: 03/25/2023  Last Visit: 02/25/2023  [id number] rheumatoid arthritis   Current Dose per office note 02/25/2023: not mentioned  Okay to refill Humira?

## 2023-03-11 NOTE — Progress Notes (Signed)
Lab results look good her sed rate and CRP are normal. So systemic inflammation seems improved even though she has some swollen joints.  Blood count shows mild elevation in white blood cells. Cholesterol levels are normal, I think she would be a good candidate for trying to change medications like we discussed if needed.

## 2023-03-23 ENCOUNTER — Other Ambulatory Visit: Payer: Self-pay | Admitting: Internal Medicine

## 2023-06-29 IMAGING — US US EXTREM LOW VENOUS*R*
1 series · 13 of 24 positions shown · non-contrast
Comparison: None.

CLINICAL DATA: Right calf pain.

EXAM:
Right LOWER EXTREMITY VENOUS DOPPLER ULTRASOUND
TECHNIQUE: Gray-scale sonography with compression, as well as color and duplex
ultrasound, were performed to evaluate the deep venous system(s)
from the level of the common femoral vein through the popliteal and
proximal calf veins.

[Series 1: us venous img lower uni right (dvt) · portal-venous · 45 acquisitions, 13 frames shown]
[im 1/45]
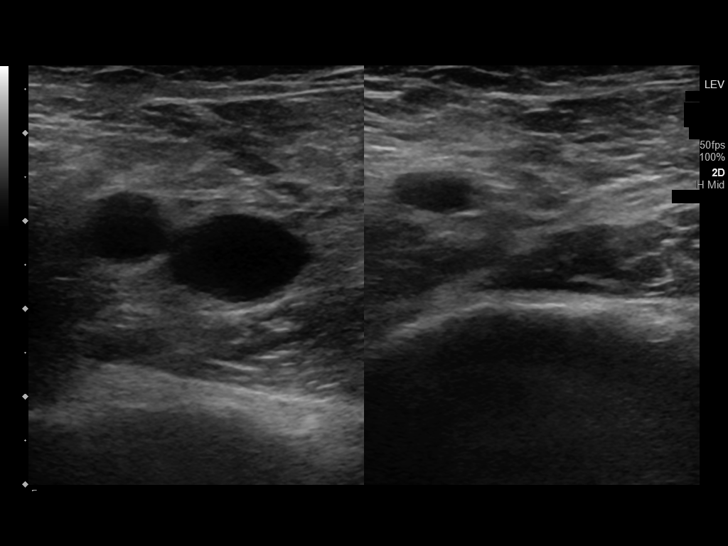
[im 4/45]
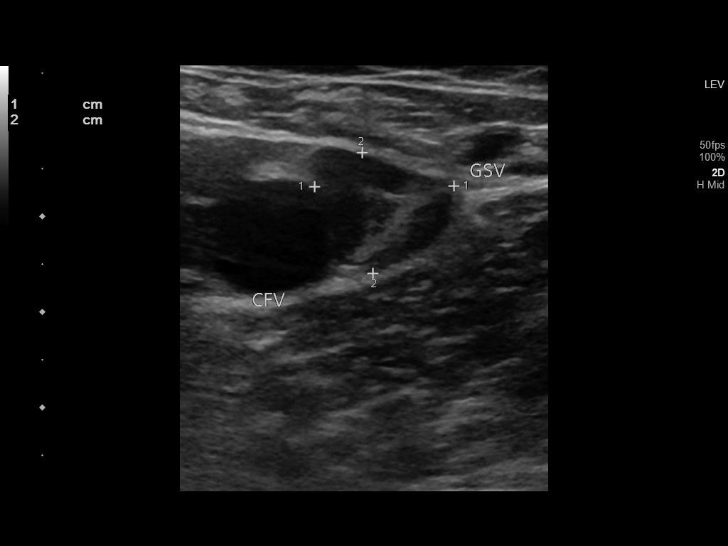
[im 8/45]
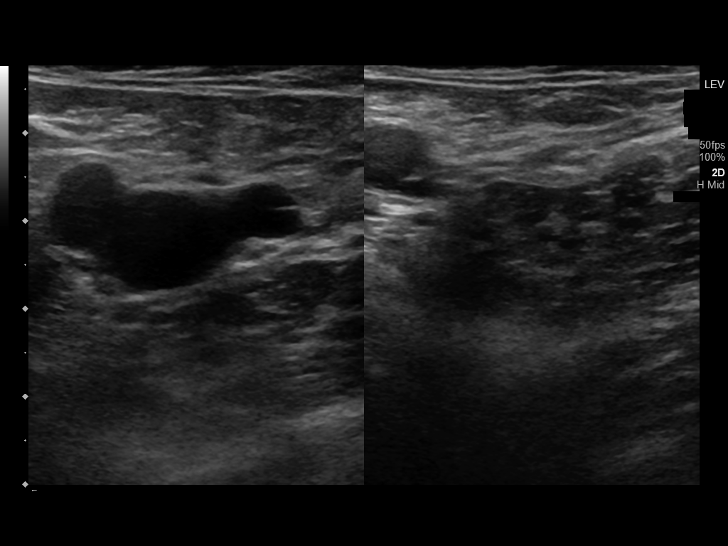
[im 14/45]
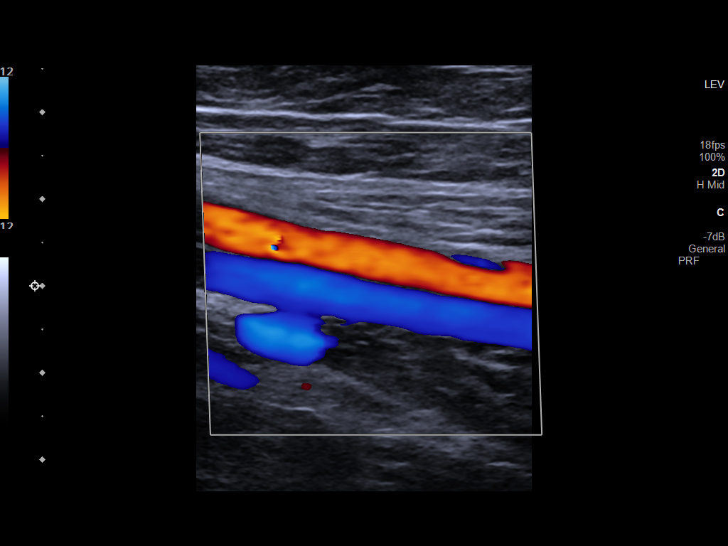
[im 18/45]
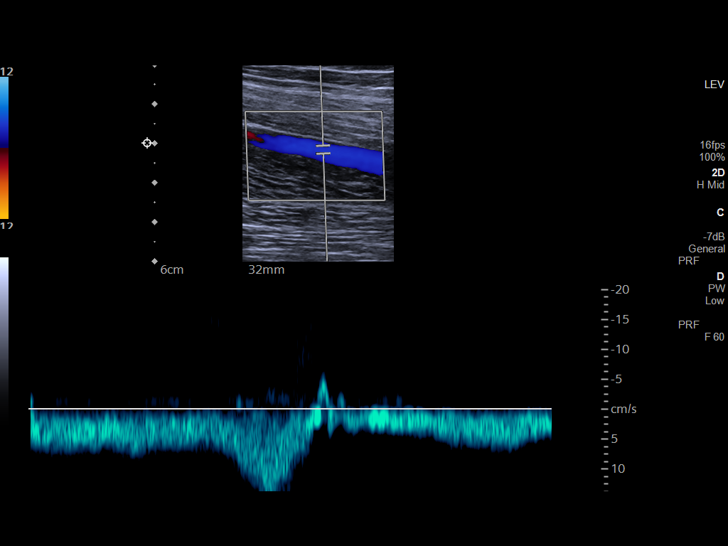
[im 22/45]
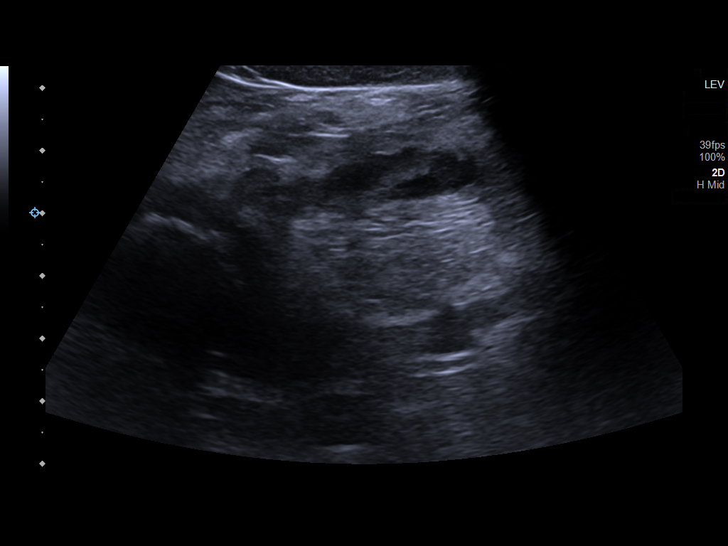
[im 23/45]
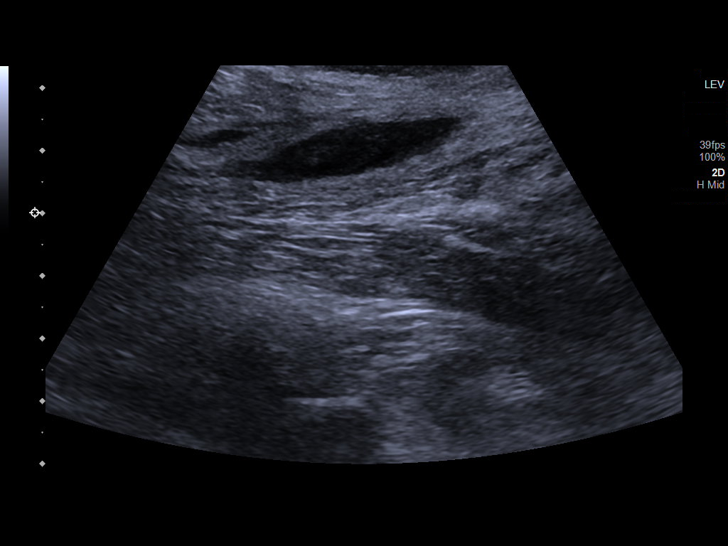
[im 23/45]
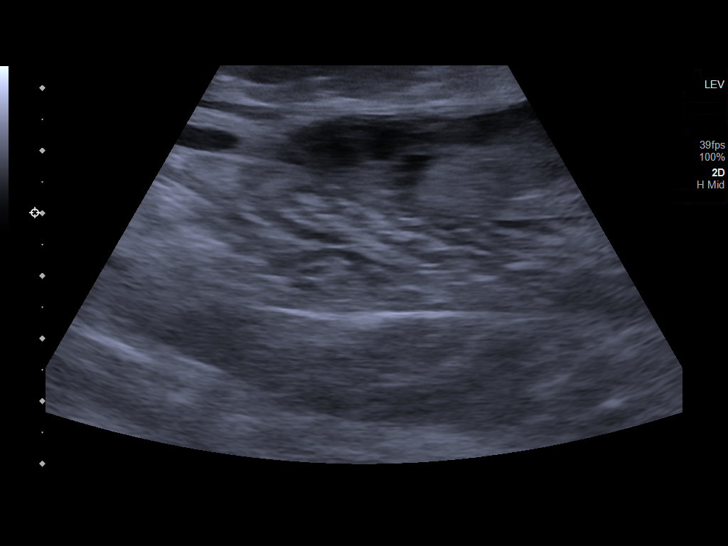
[im 27/45]
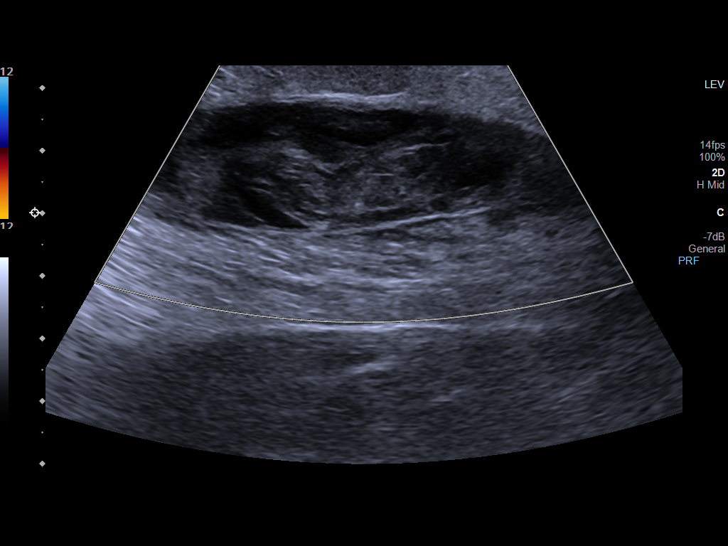
[im 31/45]
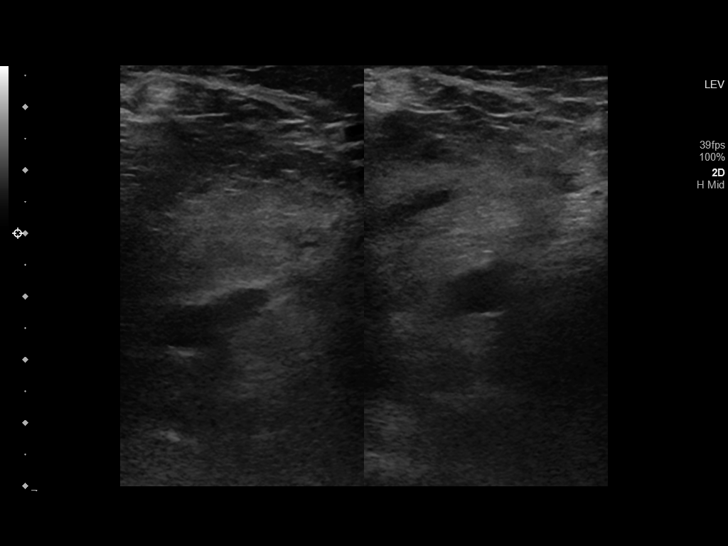
[im 37/45]
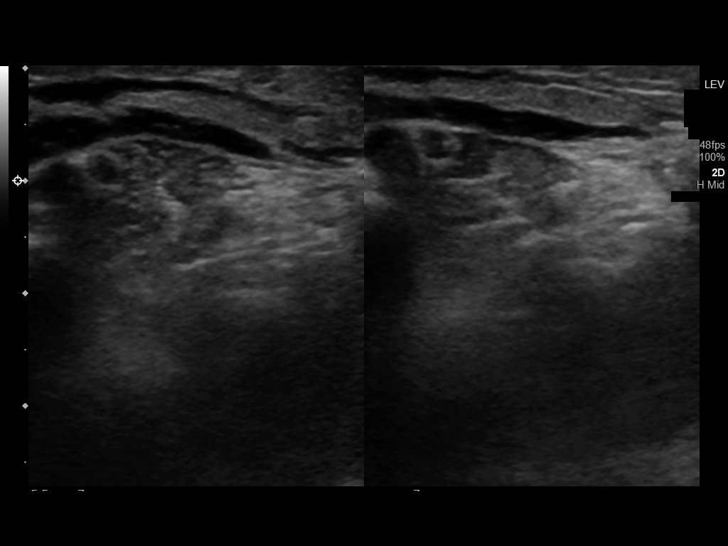
[im 41/45]
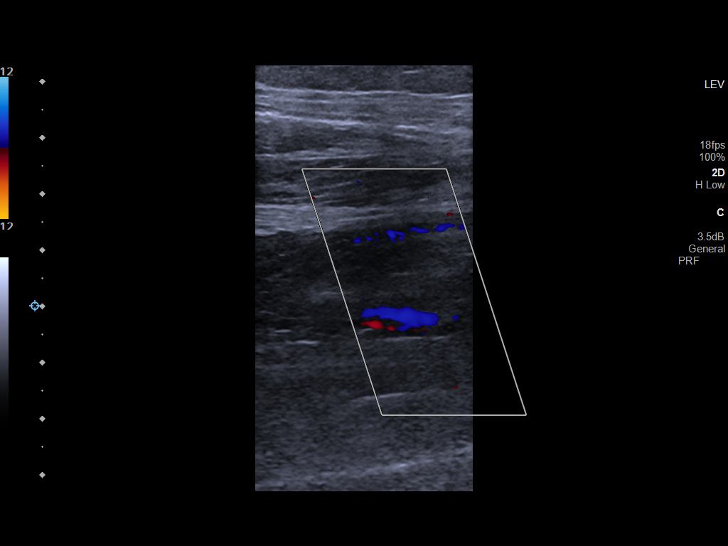
[im 45/45]
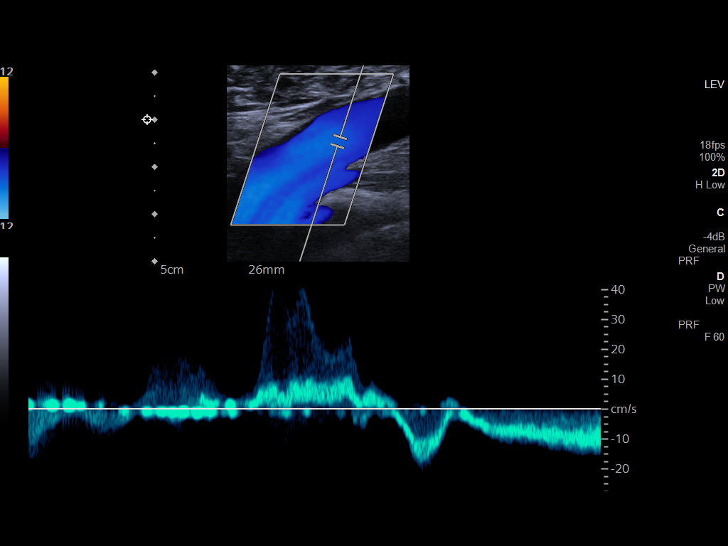

[13 of 24 positions shown; findings below may reference images not displayed]

FINDINGS: VENOUS

Normal compressibility of the common femoral, superficial femoral,
and popliteal veins, as well as the visualized calf veins.
Visualized portions of profunda femoral vein and great saphenous
vein unremarkable. No filling defects to suggest DVT on grayscale or
color Doppler imaging. Doppler waveforms show normal direction of
venous flow, normal respiratory plasticity and response to
augmentation.

Limited views of the contralateral common femoral vein are
unremarkable.

OTHER

Enlarged lymph node measuring 1.3 cm in minor axis is noted at the
junction of the common femoral and greater saphenous veins. Also
noted is 18.0 x 5.7 x 2.1 cm complex abnormality in right popliteal
fossa to mid calf level concerning for ruptured Baker's cyst or
possible hematoma.

Limitations: none
IMPRESSION: No evidence of deep venous thrombosis seen in right lower extremity.
Possible large ruptured Baker's cyst or hematoma seen in right
popliteal fossa and posterior calf. Also noted is 1.3 cm lymph node
at the junction of the common femoral and greater saphenous veins.
